# Patient Record
Sex: Male | Born: 1950 | Hispanic: No | Marital: Married | Smoking: Never smoker
Health system: Southern US, Community
[De-identification: ages and names within clinical notes are randomized; demographics above are authoritative.]

## PROBLEM LIST (undated history)

## (undated) DIAGNOSIS — E039 Hypothyroidism, unspecified: Secondary | ICD-10-CM

## (undated) DIAGNOSIS — K7460 Unspecified cirrhosis of liver: Secondary | ICD-10-CM

## (undated) DIAGNOSIS — E119 Type 2 diabetes mellitus without complications: Secondary | ICD-10-CM

## (undated) DIAGNOSIS — D61818 Other pancytopenia: Secondary | ICD-10-CM

## (undated) DIAGNOSIS — E1140 Type 2 diabetes mellitus with diabetic neuropathy, unspecified: Secondary | ICD-10-CM

## (undated) DIAGNOSIS — I10 Essential (primary) hypertension: Secondary | ICD-10-CM

## (undated) HISTORY — DX: Type 2 diabetes mellitus without complications: E11.9

## (undated) HISTORY — DX: Type 2 diabetes mellitus with diabetic neuropathy, unspecified: E11.40

---

## 2004-07-16 ENCOUNTER — Emergency Department (HOSPITAL_COMMUNITY): Admission: EM | Admit: 2004-07-16 | Discharge: 2004-07-16 | Payer: Self-pay | Admitting: Emergency Medicine

## 2012-03-19 ENCOUNTER — Emergency Department (HOSPITAL_COMMUNITY)
Admission: EM | Admit: 2012-03-19 | Discharge: 2012-03-20 | Disposition: A | Discharge status: Home / Self Care | Payer: BC Managed Care – PPO | Attending: Emergency Medicine | Admitting: Emergency Medicine

## 2012-03-19 ENCOUNTER — Encounter (HOSPITAL_COMMUNITY): Payer: Self-pay | Admitting: *Deleted

## 2012-03-19 ENCOUNTER — Emergency Department (HOSPITAL_COMMUNITY): Payer: BC Managed Care – PPO

## 2012-03-19 DIAGNOSIS — M7989 Other specified soft tissue disorders: Secondary | ICD-10-CM | POA: Insufficient documentation

## 2012-03-19 DIAGNOSIS — Z8719 Personal history of other diseases of the digestive system: Secondary | ICD-10-CM | POA: Insufficient documentation

## 2012-03-19 DIAGNOSIS — Z79899 Other long term (current) drug therapy: Secondary | ICD-10-CM | POA: Insufficient documentation

## 2012-03-19 DIAGNOSIS — R509 Fever, unspecified: Secondary | ICD-10-CM | POA: Insufficient documentation

## 2012-03-19 DIAGNOSIS — D61818 Other pancytopenia: Secondary | ICD-10-CM

## 2012-03-19 DIAGNOSIS — R799 Abnormal finding of blood chemistry, unspecified: Secondary | ICD-10-CM | POA: Insufficient documentation

## 2012-03-19 DIAGNOSIS — R259 Unspecified abnormal involuntary movements: Secondary | ICD-10-CM | POA: Insufficient documentation

## 2012-03-19 DIAGNOSIS — E039 Hypothyroidism, unspecified: Secondary | ICD-10-CM | POA: Insufficient documentation

## 2012-03-19 DIAGNOSIS — M79609 Pain in unspecified limb: Secondary | ICD-10-CM | POA: Insufficient documentation

## 2012-03-19 HISTORY — DX: Essential (primary) hypertension: I10

## 2012-03-19 LAB — URINALYSIS, ROUTINE W REFLEX MICROSCOPIC
Bilirubin Urine: NEGATIVE
Glucose, UA: NEGATIVE mg/dL
Hgb urine dipstick: NEGATIVE
Ketones, ur: NEGATIVE mg/dL
Leukocytes, UA: NEGATIVE
Nitrite: NEGATIVE
Protein, ur: NEGATIVE mg/dL
Specific Gravity, Urine: 1.006 (ref 1.005–1.030)
Urobilinogen, UA: 1 mg/dL (ref 0.0–1.0)
pH: 7 (ref 5.0–8.0)

## 2012-03-19 MED ORDER — SODIUM CHLORIDE 0.9 % IV BOLUS (SEPSIS)
1000.0000 mL | Freq: Once | INTRAVENOUS | Status: AC
  Administered 2012-03-19: 1000 mL via INTRAVENOUS

## 2012-03-19 MED ORDER — IBUPROFEN 200 MG PO TABS
600.0000 mg | ORAL_TABLET | Freq: Once | ORAL | Status: AC
  Administered 2012-03-19: 600 mg via ORAL
  Filled 2012-03-19: qty 3

## 2012-03-19 NOTE — ED Notes (Signed)
Per family report: pt reports of body aches especially in his legs.  Pt has become increasingly weak.  Family reports slurred speech that has been going on 2 months and the weakness has been going on for about 3 months.  Pt has taken a few trips to [place] with his last trip in November.  Family reports that he is unable to get out of bed today and his medications have been making him vomit.  Pt denies sore throat or cough.

## 2012-03-19 NOTE — ED Notes (Addendum)
Patient c/o BLE pain. Patient had headache at home, decreased energy. Family reports slow hunched/slumped over walking, patient states it is more comfortable for him to walk this way versus standing up straight. Family states patient is slurring his speech, not noted by Clinical research associate. Patient speech is slow. Patient states that he has become more forgetful recently. Patient reports for past few months he has been having nosebleeds in the morning, states sometimes it is daily, then it will stop for a couple days. Family states patient was wearing multiple layers when they arrived to his home, but was c/o still having chills. Patient reports having taken Tylenol at home at @1600 . Patient recently traveled to [place] in May and November of 2012, also November 2011. Family states he became ill after his travel in November 2011 and never fully recovered, symptoms similar to know, but more intense at this time. Patient states he has pain with walking, but states no pain when laying down. Denies pain at this time. 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 Patient taking Tramadol, family states this was prescribed for arthritis and leg pain, believes it was prescribed by [facility]. Family also notes that he will spontaneously fall asleep during the day, patient sleeps with his mouth open and drools, patient has stated to family he is unable to control this.
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
## 2012-03-20 LAB — CK: Total CK: 168 U/L (ref 7–232)

## 2012-03-20 LAB — DIFFERENTIAL
Basophils Absolute: 0 10*3/uL (ref 0.0–0.1)
Basophils Relative: 1 % (ref 0–1)
Eosinophils Absolute: 0.1 10*3/uL (ref 0.0–0.7)
Eosinophils Relative: 2 % (ref 0–5)
Lymphocytes Relative: 26 % (ref 12–46)
Lymphs Abs: 1 10*3/uL (ref 0.7–4.0)
Monocytes Absolute: 0.9 10*3/uL (ref 0.1–1.0)
Monocytes Relative: 23 % — ABNORMAL HIGH (ref 3–12)
Neutro Abs: 1.9 10*3/uL (ref 1.7–7.7)
Neutrophils Relative %: 49 % (ref 43–77)

## 2012-03-20 LAB — BASIC METABOLIC PANEL
BUN: 9 mg/dL (ref 6–23)
CO2: 24 mEq/L (ref 19–32)
Calcium: 8.4 mg/dL (ref 8.4–10.5)
Chloride: 104 mEq/L (ref 96–112)
Creatinine, Ser: 0.78 mg/dL (ref 0.50–1.35)
GFR calc Af Amer: >90 mL/min (ref >90)
GFR calc non Af Amer: >90 mL/min (ref >90)
Glucose, Bld: 110 mg/dL — ABNORMAL HIGH (ref 70–99)
Potassium: 3.6 mEq/L (ref 3.5–5.1)
Sodium: 137 mEq/L (ref 135–145)

## 2012-03-20 LAB — CBC
HCT: 28.6 % — ABNORMAL LOW (ref 39.0–52.0)
Hemoglobin: 9.5 g/dL — ABNORMAL LOW (ref 13.0–17.0)
MCH: 28.2 pg (ref 26.0–34.0)
MCHC: 33.2 g/dL (ref 30.0–36.0)
MCV: 84.9 fL (ref 78.0–100.0)
Platelets: 68 10*3/uL — ABNORMAL LOW (ref 150–400)
RBC: 3.37 MIL/uL — ABNORMAL LOW (ref 4.22–5.81)
RDW: 15.3 % (ref 11.5–15.5)
WBC: 3.7 10*3/uL — ABNORMAL LOW (ref 4.0–10.5)

## 2012-03-20 LAB — TSH: TSH: 23.742 u[IU]/mL — ABNORMAL HIGH (ref 0.350–4.500)

## 2012-03-21 ENCOUNTER — Emergency Department (HOSPITAL_COMMUNITY): Payer: BC Managed Care – PPO

## 2012-03-21 ENCOUNTER — Telehealth: Payer: Self-pay | Admitting: Internal Medicine

## 2012-03-21 ENCOUNTER — Encounter (HOSPITAL_COMMUNITY): Payer: Self-pay | Admitting: Radiology

## 2012-03-21 ENCOUNTER — Inpatient Hospital Stay (HOSPITAL_COMMUNITY)
Admission: EM | Admit: 2012-03-21 | Discharge: 2012-03-24 | DRG: 320 | Disposition: A | Discharge status: Home / Self Care | Payer: BC Managed Care – PPO | Attending: Internal Medicine | Admitting: Internal Medicine

## 2012-03-21 DIAGNOSIS — K7460 Unspecified cirrhosis of liver: Secondary | ICD-10-CM | POA: Diagnosis present

## 2012-03-21 DIAGNOSIS — D638 Anemia in other chronic diseases classified elsewhere: Secondary | ICD-10-CM | POA: Diagnosis present

## 2012-03-21 DIAGNOSIS — R509 Fever, unspecified: Secondary | ICD-10-CM

## 2012-03-21 DIAGNOSIS — Z79899 Other long term (current) drug therapy: Secondary | ICD-10-CM

## 2012-03-21 DIAGNOSIS — E538 Deficiency of other specified B group vitamins: Secondary | ICD-10-CM | POA: Diagnosis present

## 2012-03-21 DIAGNOSIS — D509 Iron deficiency anemia, unspecified: Secondary | ICD-10-CM | POA: Diagnosis present

## 2012-03-21 DIAGNOSIS — R7301 Impaired fasting glucose: Secondary | ICD-10-CM | POA: Diagnosis present

## 2012-03-21 DIAGNOSIS — A4901 Methicillin susceptible Staphylococcus aureus infection, unspecified site: Secondary | ICD-10-CM | POA: Diagnosis present

## 2012-03-21 DIAGNOSIS — D684 Acquired coagulation factor deficiency: Secondary | ICD-10-CM | POA: Diagnosis present

## 2012-03-21 DIAGNOSIS — E039 Hypothyroidism, unspecified: Secondary | ICD-10-CM | POA: Diagnosis present

## 2012-03-21 DIAGNOSIS — D696 Thrombocytopenia, unspecified: Secondary | ICD-10-CM | POA: Diagnosis present

## 2012-03-21 DIAGNOSIS — Z23 Encounter for immunization: Secondary | ICD-10-CM

## 2012-03-21 DIAGNOSIS — N390 Urinary tract infection, site not specified: Principal | ICD-10-CM | POA: Diagnosis present

## 2012-03-21 DIAGNOSIS — D61818 Other pancytopenia: Secondary | ICD-10-CM | POA: Diagnosis present

## 2012-03-21 DIAGNOSIS — I10 Essential (primary) hypertension: Secondary | ICD-10-CM | POA: Diagnosis present

## 2012-03-21 HISTORY — DX: Hypothyroidism, unspecified: E03.9

## 2012-03-21 HISTORY — DX: Other pancytopenia: D61.818

## 2012-03-21 HISTORY — DX: Unspecified cirrhosis of liver: K74.60

## 2012-03-21 LAB — URINALYSIS, ROUTINE W REFLEX MICROSCOPIC
Bilirubin Urine: NEGATIVE
Glucose, UA: 100 mg/dL — AB
Protein, ur: NEGATIVE mg/dL
Specific Gravity, Urine: 1.013 (ref 1.005–1.030)
Urobilinogen, UA: 0.2 mg/dL (ref 0.0–1.0)
pH: 5 (ref 5.0–8.0)

## 2012-03-21 LAB — COMPREHENSIVE METABOLIC PANEL
ALT: 25 U/L (ref 0–53)
Albumin: 2.6 g/dL — ABNORMAL LOW (ref 3.5–5.2)
Calcium: 8.4 mg/dL (ref 8.4–10.5)
GFR calc Af Amer: >90 mL/min (ref >90)
GFR calc non Af Amer: 89 mL/min — ABNORMAL LOW (ref >90)
Glucose, Bld: 204 mg/dL — ABNORMAL HIGH (ref 70–99)
Potassium: 3.3 mEq/L — ABNORMAL LOW (ref 3.5–5.1)

## 2012-03-21 LAB — CBC WITH DIFFERENTIAL/PLATELET
Basophils Relative: 0 % (ref 0–1)
Eosinophils Absolute: 0 10*3/uL (ref 0.0–0.7)
Eosinophils Relative: 0 % (ref 0–5)
Hemoglobin: 9.9 g/dL — ABNORMAL LOW (ref 13.0–17.0)
MCHC: 33.9 g/dL (ref 30.0–36.0)
MCV: 83.9 fL (ref 78.0–100.0)
Monocytes Absolute: 0.7 10*3/uL (ref 0.1–1.0)
Monocytes Relative: 14 % — ABNORMAL HIGH (ref 3–12)
Neutro Abs: 2.6 10*3/uL (ref 1.7–7.7)
RBC: 3.48 MIL/uL — ABNORMAL LOW (ref 4.22–5.81)
WBC: 5 10*3/uL (ref 4.0–10.5)

## 2012-03-21 LAB — GLUCOSE, CAPILLARY

## 2012-03-21 LAB — LACTIC ACID, PLASMA: Lactic Acid, Venous: 2 mmol/L (ref 0.5–2.2)

## 2012-03-21 LAB — URINE MICROSCOPIC-ADD ON

## 2012-03-21 LAB — PROTIME-INR
INR: 1.54 — ABNORMAL HIGH (ref 0.00–1.49)
Prothrombin Time: 18 seconds — ABNORMAL HIGH (ref 11.6–15.2)

## 2012-03-21 LAB — MAGNESIUM: Magnesium: 1.7 mg/dL (ref 1.5–2.5)

## 2012-03-21 MED ORDER — INSULIN GLARGINE 100 UNIT/ML ~~LOC~~ SOLN
5.0000 [IU] | Freq: Every day | SUBCUTANEOUS | Status: DC
  Administered 2012-03-21: 5 [IU] via SUBCUTANEOUS

## 2012-03-21 MED ORDER — ZOLPIDEM TARTRATE 5 MG PO TABS: 5.0000 mg | ORAL_TABLET | Freq: Every evening | ORAL | Status: DC | PRN

## 2012-03-21 MED ORDER — ONDANSETRON HCL 4 MG/2ML IJ SOLN: 4.0000 mg | Freq: Four times a day (QID) | INTRAMUSCULAR | Status: DC | PRN

## 2012-03-21 MED ORDER — LEVOTHYROXINE SODIUM 150 MCG PO TABS
150.0000 ug | ORAL_TABLET | Freq: Every day | ORAL | Status: DC
  Administered 2012-03-22 – 2012-03-24 (×3): 150 ug via ORAL
  Filled 2012-03-21 (×4): qty 1

## 2012-03-21 MED ORDER — ADULT MULTIVITAMIN W/MINERALS CH
1.0000 | ORAL_TABLET | Freq: Every day | ORAL | Status: DC
  Administered 2012-03-22 – 2012-03-24 (×3): 1 via ORAL
  Filled 2012-03-21 (×3): qty 1

## 2012-03-21 MED ORDER — HYDROCODONE-ACETAMINOPHEN 5-325 MG PO TABS
1.0000 | ORAL_TABLET | ORAL | Status: DC | PRN
  Administered 2012-03-21: 2 via ORAL
  Administered 2012-03-23 (×2): 1 via ORAL
  Filled 2012-03-21 (×2): qty 1
  Filled 2012-03-21: qty 2

## 2012-03-21 MED ORDER — LEVOFLOXACIN IN D5W 500 MG/100ML IV SOLN
500.0000 mg | INTRAVENOUS | Status: DC
  Administered 2012-03-21: 500 mg via INTRAVENOUS
  Filled 2012-03-21 (×2): qty 100

## 2012-03-21 MED ORDER — POLYETHYLENE GLYCOL 3350 17 G PO PACK
17.0000 g | PACK | Freq: Every day | ORAL | Status: DC | PRN
  Filled 2012-03-21: qty 1

## 2012-03-21 MED ORDER — CIPROFLOXACIN IN D5W 400 MG/200ML IV SOLN
400.0000 mg | Freq: Once | INTRAVENOUS | Status: AC
  Administered 2012-03-21: 400 mg via INTRAVENOUS
  Filled 2012-03-21: qty 200

## 2012-03-21 MED ORDER — SODIUM CHLORIDE 0.9 % IV BOLUS (SEPSIS)
1000.0000 mL | Freq: Once | INTRAVENOUS | Status: AC
  Administered 2012-03-21: 1000 mL via INTRAVENOUS

## 2012-03-21 MED ORDER — ONDANSETRON HCL 4 MG PO TABS: 4.0000 mg | ORAL_TABLET | Freq: Four times a day (QID) | ORAL | Status: DC | PRN

## 2012-03-21 MED ORDER — ALBUTEROL SULFATE (5 MG/ML) 0.5% IN NEBU: 2.5000 mg | INHALATION_SOLUTION | RESPIRATORY_TRACT | Status: DC | PRN

## 2012-03-21 MED ORDER — IOHEXOL 300 MG/ML  SOLN
100.0000 mL | Freq: Once | INTRAMUSCULAR | Status: AC | PRN
  Administered 2012-03-21: 100 mL via INTRAVENOUS

## 2012-03-21 MED ORDER — ALUM & MAG HYDROXIDE-SIMETH 200-200-20 MG/5ML PO SUSP: 30.0000 mL | Freq: Four times a day (QID) | ORAL | Status: DC | PRN

## 2012-03-21 MED ORDER — FERROUS SULFATE 27 MG PO TABS: 1.0000 | ORAL_TABLET | Freq: Every day | ORAL | Status: DC

## 2012-03-21 MED ORDER — FERROUS SULFATE 325 (65 FE) MG PO TABS
325.0000 mg | ORAL_TABLET | Freq: Every day | ORAL | Status: DC
  Filled 2012-03-21 (×2): qty 1

## 2012-03-21 MED ORDER — POTASSIUM CHLORIDE CRYS ER 20 MEQ PO TBCR
40.0000 meq | EXTENDED_RELEASE_TABLET | Freq: Once | ORAL | Status: AC
  Administered 2012-03-21: 40 meq via ORAL
  Filled 2012-03-21: qty 2

## 2012-03-21 MED ORDER — IOHEXOL 300 MG/ML  SOLN
25.0000 mL | INTRAMUSCULAR | Status: AC
  Administered 2012-03-21 (×2): 25 mL via ORAL

## 2012-03-21 MED ORDER — SODIUM CHLORIDE 0.9 % IV SOLN
INTRAVENOUS | Status: DC
  Administered 2012-03-21 – 2012-03-22 (×2): via INTRAVENOUS

## 2012-03-21 MED ORDER — INSULIN ASPART 100 UNIT/ML ~~LOC~~ SOLN
0.0000 [IU] | Freq: Three times a day (TID) | SUBCUTANEOUS | Status: DC
  Administered 2012-03-23 – 2012-03-24 (×2): 1 [IU] via SUBCUTANEOUS

## 2012-03-21 MED ORDER — MULTI-VITAMIN/MINERALS PO TABS: 1.0000 | ORAL_TABLET | Freq: Every day | ORAL | Status: DC

## 2012-03-21 MED ORDER — GUAIFENESIN-DM 100-10 MG/5ML PO SYRP
5.0000 mL | ORAL_SOLUTION | ORAL | Status: DC | PRN
  Filled 2012-03-21: qty 5

## 2012-03-21 MED ORDER — INFLUENZA VIRUS VACC SPLIT PF IM SUSP
0.5000 mL | INTRAMUSCULAR | Status: DC
  Filled 2012-03-21: qty 0.5

## 2012-03-21 NOTE — Progress Notes (Signed)
Pt was admitted to unit from ED. Pt is A&O, VS stable, and skin intact. Pt's family is currently at bedside and pt is resting comfortably in bed.

## 2012-03-21 NOTE — Telephone Encounter (Signed)
S/W pt dtr in re NP appt 02/18 @ 9:30 w/Dr. Arbutus Ped. Dx-Pancytopenia Welcome packet in-offce

## 2012-03-21 NOTE — ED Notes (Signed)
Vomited x 1 yetserrday family states  But none today

## 2012-03-21 NOTE — H&P (Addendum)
[facility]                                                                                    Patient Demographics  Jonathon Cannon, is a 62 y.o. male  CSN: [id number]  MRN: [id number]  DOB - [birth year]  Admit Date - 03/21/2012  Outpatient Primary MD for the patient is Default, Provider, MD   With History of -  Past Medical History  Diagnosis Date  . Hypertension   . Hypothyroid   . Pancytopenia   . Cirrhosis       No past surgical history on file.  in for   Fevers, generalized weakness.   HPI  Jonathon Cannon  is a 62 y.o. male, with history of hypothyroidism, hypertension, neck lower extremity aches for several years, who was in his usual state of health till about 2 weeks ago when he started experiencing generalized weakness, worsening of his lower leg aches, along with fevers and chills, he presented to [facility] ER 2 days ago where he was given pain medications and anti-inflammatory medications for his fevers and sent home.   He continued to feel poorly and today presented to [facility] where he was diagnosed with UTI, elevated TSH in mid 20s, fevers of 101 and I was called to get the patient.    Review of Systems    In addition to the HPI above,  + Fever-chills, No Headache, No changes with Vision or hearing, No problems swallowing food or Liquids, No Chest pain, Cough or Shortness of Breath, No Abdominal pain, No Nausea or Vommitting, Bowel movements are regular, No Blood in stool or Urine, No dysuria, No new skin rashes or bruises, No new joints pains-aches, +ve leg aches No new weakness, tingling, numbness in any extremity, No recent weight gain or loss, generalized weakness No polyuria, polydypsia or polyphagia, No significant Mental Stressors.  A full 10 point Review of Systems was done, except as stated above, all other Review of Systems were negative.   Social History History  Substance Use Topics  . Smoking status: Never  Smoker   . Smokeless tobacco: Not on file  . Alcohol Use: Yes     Comment: Occasional     Family History No family history of colon cancer, prostate cancer, lung cancer  Prior to Admission medications   Medication Sig Start Date End Date Taking? Authorizing Provider  Cholecalciferol (VITAMIN D3) 5000 UNITS CAPS Take 1 capsule by mouth daily.   Yes Historical Provider, MD  Ferrous Sulfate (PX IRON) 27 MG TABS Take 1 tablet by mouth daily.   Yes Historical Provider, MD  furosemide (LASIX) 20 MG tablet Take 20 mg by mouth daily.   Yes Historical Provider, MD  HYDROcodone-acetaminophen (NORCO) 10-325 MG per tablet Take 1 tablet by mouth every 8 (eight) hours as needed for pain.   Yes Historical Provider, MD  levothyroxine (SYNTHROID, LEVOTHROID) 100 MCG tablet Take 100 mcg by mouth daily.   Yes Historical Provider, MD  meloxicam (MOBIC) 15 MG tablet Take 15 mg by mouth daily as needed. For inflammation & pain   Yes Historical Provider, MD  Multiple Minerals-Vitamins (CALCIUM &  VIT D3 BONE HEALTH PO) Take 1 tablet by mouth daily.   Yes Historical Provider, MD  Multiple Vitamins-Minerals (MULTIVITAMIN WITH MINERALS) tablet Take 1 tablet by mouth daily.   Yes Historical Provider, MD  traMADol (ULTRAM) 50 MG tablet Take 50 mg by mouth 2 (two) times daily as needed for pain.   Yes Historical Provider, MD    No Known Allergies  Physical Exam  Vitals  Blood pressure 146/82, pulse 89, temperature 101.1 F (38.4 C), temperature source Oral, resp. rate 27, SpO2 100.00%.   1. General middle-aged E. Stitt Asian male lying in bed in NAD,    2. Normal affect and insight, Not Suicidal or Homicidal, Awake Alert, Oriented X 3.  3. No F.N deficits, ALL C.Nerves Intact, Strength 5/5 all 4 extremities, Sensation intact all 4 extremities, Plantars down going.  4. Ears and Eyes appear Normal, Conjunctivae clear, PERRLA. Moist Oral Mucosa.  5. Supple Neck, No JVD, No cervical lymphadenopathy  appriciated, No Carotid Bruits.  6. Symmetrical Chest wall movement, Good air movement bilaterally, CTAB.  7. RRR, No Gallops, Rubs or Murmurs, No Parasternal Heave.  8. Positive Bowel Sounds, Abdomen Soft, Non tender, No organomegaly appriciated,No rebound -guarding or rigidity.  9.  No Cyanosis, Normal Skin Turgor, No Skin Rash or Bruise.  10. Good muscle tone,  joints appear normal , no effusions, Normal ROM.  11. No Palpable Lymph Nodes in Neck or Axillae    Data Review  CBC  Recent Labs Lab 03/19/12 2345 03/21/12 1246  WBC 3.7* 5.0  HGB 9.5* 9.9*  HCT 28.6* 29.2*  PLT 68* 67*  MCV 84.9 83.9  MCH 28.2 28.4  MCHC 33.2 33.9  RDW 15.3 15.4  LYMPHSABS 1.0 1.6  MONOABS 0.9 0.7  EOSABS 0.1 0.0  BASOSABS 0.0 0.0   ------------------------------------------------------------------------------------------------------------------  Chemistries   Recent Labs Lab 03/19/12 2345 03/21/12 1246  NA 137 136  K 3.6 3.3*  CL 104 102  CO2 24 24  GLUCOSE 110* 204*  BUN 9 11  CREATININE 0.78 0.95  CALCIUM 8.4 8.4  AST  --  49*  ALT  --  25  ALKPHOS  --  128*  BILITOT  --  1.1   ------------------------------------------------------------------------------------------------------------------ CrCl is unknown because there is no height on file for the current visit. ------------------------------------------------------------------------------------------------------------------  Recent Labs  03/19/12 2345  TSH 23.742*     Coagulation profile No results found for this basename: INR, PROTIME,  in the last 168 hours ------------------------------------------------------------------------------------------------------------------- No results found for this basename: DDIMER,  in the last 72 hours -------------------------------------------------------------------------------------------------------------------  Cardiac Enzymes No results found for this basename: CK,  CKMB, TROPONINI, MYOGLOBIN,  in the last 168 hours ------------------------------------------------------------------------------------------------------------------ No components found with this basename: POCBNP,    ---------------------------------------------------------------------------------------------------------------  Urinalysis    Component Value Date/Time   COLORURINE YELLOW 03/21/2012 1300   APPEARANCEUR CLEAR 03/21/2012 1300   LABSPEC 1.013 03/21/2012 1300   PHURINE 5.0 03/21/2012 1300   GLUCOSEU 100* 03/21/2012 1300   HGBUR TRACE* 03/21/2012 1300   BILIRUBINUR NEGATIVE 03/21/2012 1300   KETONESUR NEGATIVE 03/21/2012 1300   PROTEINUR NEGATIVE 03/21/2012 1300   UROBILINOGEN 0.2 03/21/2012 1300   NITRITE NEGATIVE 03/21/2012 1300   LEUKOCYTESUR MODERATE* 03/21/2012 1300    ----------------------------------------------------------------------------------------------------------------  Imaging results:   Dg Chest 2 View  03/21/2012  *RADIOLOGY REPORT*  Clinical Data: cough  CHEST - 2 VIEW  Comparison: 03/19/2012  Findings: Normal heart size.  No pleural effusion or edema.  There is no airspace consolidation identified.  Spondylosis noted within the thoracic spine.  IMPRESSION:  1.  No acute cardiopulmonary abnormalities.   Original Report Authenticated By: Signa Kell, M.D.    Dg Chest 2 View  03/19/2012  *RADIOLOGY REPORT*  Clinical Data: Fever and weakness for 2 days.  CHEST - 2 VIEW  Comparison: None.  Findings: The lungs are clear and fully expanded.  No infiltrates or masses.  No effusions or pneumothoraces.  The aorta is tortuous. The heart and mediastinal structures are normal.  There are no acute bony changes.  IMPRESSION: No active disease.   Original Report Authenticated By: Sander Radon, M.D.    Ct Head Wo Contrast  03/20/2012  *RADIOLOGY REPORT*  Clinical Data: Body aches and weakness; confusion.  Slurred speech.  CT HEAD WITHOUT CONTRAST  Technique:  Contiguous  axial images were obtained from the base of the skull through the vertex without contrast.  Comparison: None.  Findings: There is no evidence of acute infarction, mass lesion, or intra- or extra-axial hemorrhage on CT.  The posterior fossa, including the cerebellum, brainstem and fourth ventricle, is within normal limits.  The third and lateral ventricles, and basal ganglia are unremarkable in appearance.  The cerebral hemispheres are symmetric in appearance, with normal gray- white differentiation.  No mass effect or midline shift is seen.  There is no evidence of fracture; visualized osseous structures are unremarkable in appearance.  The orbits are within normal limits. A mucus retention cyst or polyp is noted at the left maxillary sinus, and mild mucosal thickening is noted at the right maxillary sinus.  There is partial opacification of the ethmoid air cells. The The remaining paranasal sinuses and mastoid air cells are well- aerated.  No significant soft tissue abnormalities are seen.  IMPRESSION:  1.  No acute intracranial pathology seen on CT. 2.  Mucus retention cyst or polyp at the left maxillary sinus, and mild mucosal thickening at the right maxillary sinus.   Original Report Authenticated By: Tonia Ghent, M.D.    Ct Abdomen Pelvis W Contrast  03/21/2012  *RADIOLOGY REPORT*  Clinical Data: Fever, abdominal pain.  CT ABDOMEN AND PELVIS WITH CONTRAST  Technique:  Multidetector CT imaging of the abdomen and pelvis was performed following the standard protocol during bolus administration of intravenous contrast.  Contrast: OMNIPAQUE IOHEXOL 300 MG/ML  SOLN  Comparison: None.  Findings: Visualized lung bases clear.  Liver has a nodular contour, without focal lesion or intrahepatic biliary ductal dilatation.  Gallbladder is nondistended.  Splenomegaly, 16.2 cm craniocaudal length.  Unremarkable adrenal glands, pancreas, kidneys.  Patchy aortoiliac plaque without aneurysm.  Stomach, small bowel, and  colon are nondilated.  Normal appendix.  No ascites.  Urinary bladder incompletely distended.  Bilateral pelvic phleboliths.  No of free air.  Mild spondylitic changes in the lower lumbar spine.  IMPRESSION:  1.  Nodular liver contour suggesting cirrhosis, with splenomegaly but no other stigmata of portal venous hypertension. 2.  No acute abdominal process.   Original Report Authenticated By: D. Andria Rhein, MD      Assessment & Plan  1. Fevers, lower leg aches acute on chronic, UTI along with uncontrolled hypothyroidism and possible undiagnosed diabetes mellitus - he she will be admitted to the hospital, blood and urine cultures will be obtained, will check renal ultrasound to rule out prostatic obstruction, PSA levels will be ordered, temperature Levaquin to be dosed by pharmacy, IV fluids, since his leg aches are worse we'll check CK levels.   2. Poorly controlled hypothyroidism  with TSH in mid 20s. Will increase home dose Synthroid, repeat TSH free T3 in 4-6 weeks outpatient.   3. Cirrhosis noted on CT scan incidentally. With some splenomegaly. Patient denies any history of hepatitis, no history of significant alcohol use. This could represent Elita Boone. Will check acute hepatitis panel, outpatient GI followup post discharge.   4. Normocytic anemia. The anemia of chronic disease due to cirrhosis above along with thrombocytopenia which also could be from cirrhosis or organomegaly.- We'll avoid blood thinners, will check anemia panel, if iron deficient iron deficiency workup can be initiated outpatient. Will order a peripheral smear. He has outpatient followup with Dr. Arbutus Ped will follow with him post discharge.   5. Elevated sugars. No previous history of diabetes mellitus, will check A1c, we'll place him on low-dose Lantus along with sliding scale insulin and car modified diet.    DVT Prophylaxis   SCDs    AM Labs Ordered, also please review Full Orders  Family Communication: Admission,  patients condition and plan of care including tests being ordered have been discussed with the patient and family who indicate understanding and agree with the plan and Code Status.  Code Status full  Likely DC to  home  Time spent in minutes : 40  Condition Marinell Blight K M.D on 03/21/2012 at 6:59 PM  Between 7am to 7pm - Pager - [phone]  After 7pm go to [email or web address] - password [email or web address]  And look for the night coverage person covering me after hours  [facility] Office  [phone]

## 2012-03-21 NOTE — ED Provider Notes (Signed)
Handoff from Lawyer PA-C at shift change.  Patient pending CT abdomen and pelvis.   CT abdomen and pelvis shows splenomegaly with possible cirrhotic changes of the liver. Patient has increased fever and continues to feel weak. These findings I would make for further evaluation. Patient has received IV Cipro in emergency department.  Patient and family informed.  No further vomiting.   [facility] to see patient.   Renne Crigler, [place] 03/21/12 [phone]

## 2012-03-21 NOTE — ED Provider Notes (Signed)
History     CSN: [id number]  Arrival date & time 03/21/12  1225   First MD Initiated Contact with Patient 03/21/12 1308      No chief complaint on file.   (Consider location/radiation/quality/duration/timing/severity/associated sxs/prior treatment) HPI Patient presents emergency department with fever, body aches, and abdominal pain for the last 5 days.  Patient was seen at [facility] long emergency department and had blood work and x-rays done at that time and was referred to hematology for anemia.  Patient presents emergency department for further evaluation of fever with vomiting and abdominal pain.  Patient denies chest pain, shortness of breath, headache, visual changes, back pain, weakness, numbness, dizziness, or syncope.  Patient was not given any treatment prior to arrival.  Nothing seems to make his condition, better.ating makes his condition worse Past Medical History  Diagnosis Date  . Hypertension     No past surgical history on file.  No family history on file.  History  Substance Use Topics  . Smoking status: Never Smoker   . Smokeless tobacco: Not on file  . Alcohol Use: Yes     Comment: Occasional      Review of Systems All other systems negative except as documented in the HPI. All pertinent positives and negatives as reviewed in the HPI. Allergies  Review of patient's allergies indicates no known allergies.  Home Medications   Current Outpatient Rx  Name  Route  Sig  Dispense  Refill  . Cholecalciferol (VITAMIN D3) 5000 UNITS CAPS   Oral   Take 1 capsule by mouth daily.         . Ferrous Sulfate (PX IRON) 27 MG TABS   Oral   Take 1 tablet by mouth daily.         . furosemide (LASIX) 20 MG tablet   Oral   Take 20 mg by mouth daily.         Marland Kitchen HYDROcodone-acetaminophen (NORCO) 10-325 MG per tablet   Oral   Take 1 tablet by mouth every 8 (eight) hours as needed for pain.         Marland Kitchen levothyroxine (SYNTHROID, LEVOTHROID) 100 MCG tablet    Oral   Take 100 mcg by mouth daily.         . meloxicam (MOBIC) 15 MG tablet   Oral   Take 15 mg by mouth daily as needed. For inflammation & pain         . Multiple Minerals-Vitamins (CALCIUM & VIT D3 BONE HEALTH PO)   Oral   Take 1 tablet by mouth daily.         . Multiple Vitamins-Minerals (MULTIVITAMIN WITH MINERALS) tablet   Oral   Take 1 tablet by mouth daily.         . traMADol (ULTRAM) 50 MG tablet   Oral   Take 50 mg by mouth 2 (two) times daily as needed for pain.           BP 133/80  Pulse 89  Temp(Src) 99.7 F (37.6 C) (Oral)  Resp 16  SpO2 100%  Physical Exam  Nursing note and vitals reviewed. Constitutional: He appears well-developed and well-nourished.  HENT:  Head: Normocephalic and atraumatic.  Eyes: Pupils are equal, round, and reactive to light.  Neck: Normal range of motion. Neck supple.  Cardiovascular: Normal rate, regular rhythm and normal heart sounds.   Pulmonary/Chest: Effort normal and breath sounds normal. No respiratory distress.  Abdominal: Soft. He exhibits no distension. There is generalized  tenderness. There is no rebound and no guarding.    Neurological: He is alert.  Skin: Skin is warm and dry. No rash noted.    ED Course  Procedures (including critical care time)  Labs Reviewed  CBC WITH DIFFERENTIAL - Abnormal; Notable for the following:    RBC 3.48 (*)    Hemoglobin 9.9 (*)    HCT 29.2 (*)    Platelets 67 (*)    Monocytes Relative 14 (*)    All other components within normal limits  COMPREHENSIVE METABOLIC PANEL - Abnormal; Notable for the following:    Potassium 3.3 (*)    Glucose, Bld 204 (*)    Albumin 2.6 (*)    AST 49 (*)    Alkaline Phosphatase 128 (*)    GFR calc non Af Amer 89 (*)    All other components within normal limits  URINALYSIS, ROUTINE W REFLEX MICROSCOPIC - Abnormal; Notable for the following:    Glucose, UA 100 (*)    Hgb urine dipstick TRACE (*)    Leukocytes, UA MODERATE (*)    All  other components within normal limits  URINE MICROSCOPIC-ADD ON - Abnormal; Notable for the following:    Bacteria, UA MANY (*)    Casts HYALINE CASTS (*)    All other components within normal limits  URINE CULTURE  LACTIC ACID, PLASMA   Dg Chest 2 View  03/21/2012  *RADIOLOGY REPORT*  Clinical Data: cough  CHEST - 2 VIEW  Comparison: 03/19/2012  Findings: Normal heart size.  No pleural effusion or edema.  There is no airspace consolidation identified.  Spondylosis noted within the thoracic spine.  IMPRESSION:  1.  No acute cardiopulmonary abnormalities.   Original Report Authenticated By: Signa Kell, M.D.    Dg Chest 2 View  03/19/2012  *RADIOLOGY REPORT*  Clinical Data: Fever and weakness for 2 days.  CHEST - 2 VIEW  Comparison: None.  Findings: The lungs are clear and fully expanded.  No infiltrates or masses.  No effusions or pneumothoraces.  The aorta is tortuous. The heart and mediastinal structures are normal.  There are no acute bony changes.  IMPRESSION: No active disease.   Original Report Authenticated By: Sander Radon, M.D.    Ct Head Wo Contrast  03/20/2012  *RADIOLOGY REPORT*  Clinical Data: Body aches and weakness; confusion.  Slurred speech.  CT HEAD WITHOUT CONTRAST  Technique:  Contiguous axial images were obtained from the base of the skull through the vertex without contrast.  Comparison: None.  Findings: There is no evidence of acute infarction, mass lesion, or intra- or extra-axial hemorrhage on CT.  The posterior fossa, including the cerebellum, brainstem and fourth ventricle, is within normal limits.  The third and lateral ventricles, and basal ganglia are unremarkable in appearance.  The cerebral hemispheres are symmetric in appearance, with normal gray- white differentiation.  No mass effect or midline shift is seen.  There is no evidence of fracture; visualized osseous structures are unremarkable in appearance.  The orbits are within normal limits. A mucus retention  cyst or polyp is noted at the left maxillary sinus, and mild mucosal thickening is noted at the right maxillary sinus.  There is partial opacification of the ethmoid air cells. The The remaining paranasal sinuses and mastoid air cells are well- aerated.  No significant soft tissue abnormalities are seen.  IMPRESSION:  1.  No acute intracranial pathology seen on CT. 2.  Mucus retention cyst or polyp at the left maxillary sinus, and  mild mucosal thickening at the right maxillary sinus.   Original Report Authenticated By: Tonia Ghent, M.D.      1. Fever       MDM          Carlyle Dolly, PA-C 03/21/12 1605

## 2012-03-21 NOTE — ED Notes (Signed)
Patient transported to X-ray 

## 2012-03-21 NOTE — ED Notes (Signed)
P c/o fever, has PCP appoinment toady at 3. Was seen at [facility] sat for same

## 2012-03-21 NOTE — ED Notes (Signed)
Internal Medicine MD at bedside

## 2012-03-21 NOTE — ED Notes (Signed)
Fever that keeps coming back was seen at [facility] on sat  But pt still sick temp running over 100 she states

## 2012-03-21 NOTE — ED Notes (Signed)
Called CT to inform them that pt was done drinking first cup of contrast

## 2012-03-22 ENCOUNTER — Other Ambulatory Visit: Payer: BC Managed Care – PPO | Admitting: Lab

## 2012-03-22 ENCOUNTER — Inpatient Hospital Stay (HOSPITAL_COMMUNITY): Payer: BC Managed Care – PPO

## 2012-03-22 ENCOUNTER — Ambulatory Visit: Payer: BC Managed Care – PPO | Admitting: Internal Medicine

## 2012-03-22 ENCOUNTER — Ambulatory Visit: Payer: BC Managed Care – PPO

## 2012-03-22 DIAGNOSIS — E538 Deficiency of other specified B group vitamins: Secondary | ICD-10-CM

## 2012-03-22 DIAGNOSIS — D638 Anemia in other chronic diseases classified elsewhere: Secondary | ICD-10-CM

## 2012-03-22 DIAGNOSIS — D509 Iron deficiency anemia, unspecified: Secondary | ICD-10-CM | POA: Diagnosis present

## 2012-03-22 LAB — COMPREHENSIVE METABOLIC PANEL
ALT: 19 U/L (ref 0–53)
AST: 37 U/L (ref 0–37)
Alkaline Phosphatase: 107 U/L (ref 39–117)
CO2: 24 mEq/L (ref 19–32)
Calcium: 7.9 mg/dL — ABNORMAL LOW (ref 8.4–10.5)
Chloride: 107 mEq/L (ref 96–112)
GFR calc non Af Amer: >90 mL/min (ref >90)
Glucose, Bld: 113 mg/dL — ABNORMAL HIGH (ref 70–99)
Potassium: 3.3 mEq/L — ABNORMAL LOW (ref 3.5–5.1)
Sodium: 139 mEq/L (ref 135–145)

## 2012-03-22 LAB — HEPATITIS PANEL, ACUTE
Hep A IgM: NEGATIVE
Hep B C IgM: NEGATIVE
Hepatitis B Surface Ag: NEGATIVE

## 2012-03-22 LAB — GLUCOSE, CAPILLARY
Glucose-Capillary: 90 mg/dL (ref 70–99)
Glucose-Capillary: 140 mg/dL — ABNORMAL HIGH (ref 70–99)

## 2012-03-22 LAB — INFLUENZA PANEL BY PCR (TYPE A & B)
H1N1 flu by pcr: NOT DETECTED
Influenza B By PCR: NEGATIVE

## 2012-03-22 LAB — VITAMIN B12: Vitamin B-12: 191 pg/mL — ABNORMAL LOW (ref 211–911)

## 2012-03-22 LAB — FOLATE: Folate: 15.9 ng/mL

## 2012-03-22 LAB — IRON AND TIBC
Saturation Ratios: 5 % — ABNORMAL LOW (ref 20–55)
TIBC: 374 ug/dL (ref 215–435)
UIBC: 354 ug/dL (ref 125–400)

## 2012-03-22 LAB — CBC
Hemoglobin: 8.6 g/dL — ABNORMAL LOW (ref 13.0–17.0)
MCH: 27.5 pg (ref 26.0–34.0)
Platelets: 60 10*3/uL — ABNORMAL LOW (ref 150–400)
RBC: 3.13 MIL/uL — ABNORMAL LOW (ref 4.22–5.81)
WBC: 4.6 10*3/uL (ref 4.0–10.5)

## 2012-03-22 MED ORDER — SENNOSIDES-DOCUSATE SODIUM 8.6-50 MG PO TABS
2.0000 | ORAL_TABLET | Freq: Every day | ORAL | Status: DC
  Filled 2012-03-22: qty 2

## 2012-03-22 MED ORDER — LEVOFLOXACIN IN D5W 500 MG/100ML IV SOLN
500.0000 mg | INTRAVENOUS | Status: DC
  Administered 2012-03-22 – 2012-03-24 (×3): 500 mg via INTRAVENOUS
  Filled 2012-03-22 (×3): qty 100

## 2012-03-22 MED ORDER — CYANOCOBALAMIN 1000 MCG/ML IJ SOLN
1000.0000 ug | Freq: Every day | INTRAMUSCULAR | Status: DC
  Administered 2012-03-22 – 2012-03-24 (×3): 1000 ug via INTRAMUSCULAR
  Filled 2012-03-22 (×3): qty 1

## 2012-03-22 MED ORDER — PANTOPRAZOLE SODIUM 40 MG PO TBEC
40.0000 mg | DELAYED_RELEASE_TABLET | Freq: Every day | ORAL | Status: DC
  Administered 2012-03-22 – 2012-03-24 (×3): 40 mg via ORAL
  Filled 2012-03-22 (×2): qty 1

## 2012-03-22 MED ORDER — POTASSIUM CHLORIDE IN NACL 40-0.9 MEQ/L-% IV SOLN
INTRAVENOUS | Status: AC
  Administered 2012-03-22 – 2012-03-23 (×3): via INTRAVENOUS
  Filled 2012-03-22 (×3): qty 1000

## 2012-03-22 MED ORDER — SODIUM CHLORIDE 0.9 % IV SOLN
1000.0000 mg | Freq: Once | INTRAVENOUS | Status: DC
  Filled 2012-03-22 (×2): qty 20

## 2012-03-22 MED ORDER — DIPHENHYDRAMINE HCL 50 MG/ML IJ SOLN
INTRAMUSCULAR | Status: AC
  Administered 2012-03-22: 25 mg via INTRAVENOUS
  Filled 2012-03-22: qty 1

## 2012-03-22 MED ORDER — SODIUM CHLORIDE 0.9 % IV SOLN
25.0000 mg | Freq: Once | INTRAVENOUS | Status: AC
  Administered 2012-03-22: 25 mg via INTRAVENOUS
  Filled 2012-03-22: qty 0.5

## 2012-03-22 MED ORDER — DIPHENHYDRAMINE HCL 50 MG/ML IJ SOLN
25.0000 mg | Freq: Once | INTRAMUSCULAR | Status: AC
  Administered 2012-03-22: 25 mg via INTRAVENOUS

## 2012-03-22 MED ORDER — MAGNESIUM SULFATE 40 MG/ML IJ SOLN
2.0000 g | Freq: Once | INTRAMUSCULAR | Status: AC
  Administered 2012-03-22: 2 g via INTRAVENOUS
  Filled 2012-03-22: qty 50

## 2012-03-22 MED ORDER — SODIUM CHLORIDE 0.9 % IV SOLN
1020.0000 mg | Freq: Once | INTRAVENOUS | Status: AC
  Administered 2012-03-22: 1020 mg via INTRAVENOUS
  Filled 2012-03-22: qty 34

## 2012-03-22 MED ORDER — POTASSIUM CHLORIDE CRYS ER 20 MEQ PO TBCR
40.0000 meq | EXTENDED_RELEASE_TABLET | Freq: Once | ORAL | Status: AC
  Administered 2012-03-22: 40 meq via ORAL
  Filled 2012-03-22: qty 2

## 2012-03-22 NOTE — Progress Notes (Signed)
Inpatient Diabetes Program Recommendations  AACE/ADA: New Consensus Statement on Inpatient Glycemic Control (2013)  Target Ranges:  Prepandial:   less than 140 mg/dL      Peak postprandial:   less than 180 mg/dL (1-2 hours)      Critically ill patients:  140 - 180 mg/dL   Reason for Visit: No diagnosis of diabetes yet, but Hgb A1C is 6.3  Inpatient Diabetes Program Recommendations HgbA1C: Hgb A1C is 6.3 Outpatient Referral: Daughter, Aundria Rud is receptive to [facility].  Wants to find out what his co-pay will be before she makes an appointment.   Note: Patient is sleeping soundly-- had received Benadryl to treat a possible medication reaction and has been sleeping a lot since.  Daughter, Rajini Ni and her husband are present and very receptive to my visit.  Daughter states that the patient sleeps a lot anyway-- as did his father before him.  Daughter states that patient has been told of some slight elevations in blood glucose in the past (at least once at a diabetes screening, etc.) but has never been diagnosed with diabetes.  Daughter questioning if discoloration in his skin about his face could be related to elevated blood glucose.  Patient sleeping, so did not check for acanthosis nigricans.    States they eat a lot of fresh foods- mostly cooked- and he is probably about 90% vegetarian.  Discussed need to be aware of high CHO containing foods so intake of these foods can be controlled to help with glycemic control.  Will place Dietitian Consult.  Will also place a 1:1 outpatient consult to [facility] for nutrition education for possible pre-diabetes.  Daughter wants to check to see what his co-pay would be for education before they make an appointment.  Will need 1:1 education due need to address his particular culture.  Thank you.  Ardyn Forge S. Elsie Lincoln, RN, CNS, CDE Inpatient Diabetes Program, team pager [phone]

## 2012-03-22 NOTE — ED Provider Notes (Signed)
Medical screening examination/treatment/procedure(s) were performed by non-physician practitioner and as supervising physician I was immediately available for consultation/collaboration.  Jerie Basford T Efrem Pitstick, MD 03/22/12 1517 

## 2012-03-22 NOTE — Progress Notes (Signed)
03/22/12 1005  Vitals  Temp 99.2 F (37.3 C)  Temp src Oral  BP 124/72 mmHg  BP Location Left arm  BP Method Automatic  Patient Position, if appropriate Lying  Pulse Rate ! 120  Pulse Rate Source Dinamap  Oxygen Therapy  SpO2 97 %  O2 Device None (Room air)  patient had reaction to test dose of infed. Dr notified. New orders received. Will not give full dose Infed.

## 2012-03-22 NOTE — Evaluation (Signed)
Physical Therapy Evaluation Patient Details Name: Jonathon Cannon MRN: [id number] DOB: [birth year] Today's Date: 03/22/2012 Time: 0825-0840 PT Time Calculation (min): 15 min  PT Assessment / Plan / Recommendation Clinical Impression  Pt. is a 62 y/o male admitted with progressive weakness; found to have UTI.  Pt. near baseline for mobility and feel pt. can safely d/c home with family supervision.    PT Assessment  Patient needs continued PT services    Follow Up Recommendations  No PT follow up    Does the patient have the potential to tolerate intense rehabilitation      Barriers to Discharge None      Equipment Recommendations  None recommended by PT    Recommendations for Other Services     Frequency Min 3X/week    Precautions / Restrictions Precautions Precautions: None Restrictions Weight Bearing Restrictions: No   Pertinent Vitals/Pain Pt. Reported R foot pain with ambulation.  Pt stated pain occurs when it is cold outside and reports chronic pain.      Mobility  Bed Mobility Bed Mobility: Supine to Sit Supine to Sit: 7: Independent Transfers Transfers: Sit to Stand;Stand to Sit Sit to Stand: 7: Independent;From bed Stand to Sit: 7: Independent;To bed Ambulation/Gait Ambulation/Gait Assistance: 4: Min guard Ambulation Distance (Feet): 200 Feet Assistive device: None;Other (Comment) (utilized IV pole x 100') Ambulation/Gait Assistance Details: increased antalgic gait without IV pole for assistance Gait Pattern: Antalgic (due to R foot pain) Stairs: No Wheelchair Mobility Wheelchair Mobility: No    Exercises     PT Diagnosis: Difficulty walking;Abnormality of gait;Acute pain  PT Problem List: Decreased activity tolerance;Decreased mobility;Pain PT Treatment Interventions: Gait training;Stair training;Functional mobility training;Therapeutic activities;Therapeutic exercise;Balance training;Neuromuscular re-education;Patient/family education   PT  Goals Acute Rehab PT Goals PT Goal Formulation: With patient Time For Goal Achievement: 03/29/12 Potential to Achieve Goals: Good Pt will go Sit to Supine/Side: Independently;with HOB 0 degrees PT Goal: Sit to Supine/Side - Progress: Goal set today Pt will Ambulate: >150 feet;Independently PT Goal: Ambulate - Progress: Goal set today Pt will Go Up / Down Stairs: Flight;with modified independence;with rail(s) PT Goal: Up/Down Stairs - Progress: Goal set today  Visit Information  Last PT Received On: 03/22/12 Assistance Needed: +1    Subjective Data  Subjective: "I feel better." Patient Stated Goal: to go home   Prior Functioning  Home Living Lives With: Family;Spouse;Daughter Available Help at Discharge: Family;Available 24 hours/day Type of Home: House Home Access: Stairs to enter [employer] of Steps: 2 Entrance Stairs-Rails: None Home Layout: Two level;1/2 bath on main level Alternate Level Stairs-Number of Steps: 13 Alternate Level Stairs-Rails: Right Home Adaptive Equipment: None Prior Function Level of Independence: Independent Able to Take Stairs?: Yes Vocation: Retired Musician: Prefers language other than English;No difficulties (son-in-law present to assist with language barrier)    Cognition  Cognition Overall Cognitive Status: Appears within functional limits for tasks assessed/performed Arousal/Alertness: Awake/alert Orientation Level: Appears intact for tasks assessed Behavior During Session: [facility] for tasks performed    Extremity/Trunk Assessment Right Lower Extremity Assessment RLE ROM/Strength/Tone: Within functional levels RLE Coordination: WFL - gross motor Left Lower Extremity Assessment LLE ROM/Strength/Tone: Within functional levels LLE Coordination: WFL - gross motor Trunk Assessment Trunk Assessment: Normal   Balance Balance Balance Assessed: Yes Static Standing Balance Static Standing - Level of Assistance:  5: Stand by assistance  End of Session PT - End of Session Equipment Utilized During Treatment: Gait belt Activity Tolerance: Patient tolerated treatment well Patient left: in chair;with family/visitor  present (W/C transport to ultrasound) Nurse Communication: Mobility status  GP Functional Assessment Tool Used: clinical judgement Functional Limitation: Mobility: Walking and moving around Mobility: Walking and Moving Around Current Status ([id number]): At least 1 percent but less than 20 percent impaired, limited or restricted Mobility: Walking and Moving Around Goal Status [phone]): 0 percent impaired, limited or restricted   Timoteo Gaul 03/22/2012, 8:50 AM  Timoteo Gaul, PT, DPT [phone]

## 2012-03-22 NOTE — Care Management Note (Signed)
    Page 1 of 1   03/24/2012     10:49:28 AM   CARE MANAGEMENT NOTE 03/24/2012  Patient:  Jonathon Cannon,Jonathon Cannon   Account Number:  [id number]  Date Initiated:  03/22/2012  Documentation initiated by:  Letha Cape  Subjective/Objective Assessment:   dx uti  admit- lives with spouse, pta indep.     Action/Plan:   Anticipated DC Date:  03/24/2012   Anticipated DC Plan:  HOME/SELF CARE      DC Planning Services  CM consult      Choice offered to / List presented to:             Status of service:  Completed, signed off Medicare Important Message given?   (If response is "NO", the following Medicare IM given date fields will be blank) Date Medicare IM given:   Date Additional Medicare IM given:    Discharge Disposition:  HOME/SELF CARE  Per UR Regulation:  Reviewed for med. necessity/level of care/duration of stay  If discussed at Long Length of Stay Meetings, dates discussed:    Comments:  PCP Dr. Neale Burly [phone]: Contact (daughter) Rajni [phone]  03/24/12 10:48 Letha Cape RN, BSN [phone] patient is for discharge today.  03/22/12 14:37 Letha Cape RN, BSN [phone] patient lives with spouse, pta indep.  Patient has medication coverage and transportation at discharge.  No needs anticipated.

## 2012-03-22 NOTE — Progress Notes (Signed)
[facility]                                                                                Patient Demographics  Jonathon Cannon, is a 62 y.o. male, DOB - 02/10/1950, ZOX:[id number], [id number]  Admit date - 03/21/2012  Admitting Physician Jonathon Sea, MD  Outpatient Primary MD for the patient is Jonathon Cannon, Provider, MD  LOS - 1   No chief complaint on file.       Assessment & Plan     1. Fevers, lower leg aches acute on chronic, UTI along with uncontrolled hypothyroidism, anemia - he she will be admitted to the hospital, blood and urine cultures will be obtained, will check  abd ultrasound to rule out prostatic obstruction, PSA levels stable, follow temperature curve, Levaquin to be dosed by pharmacy, IV fluids, since his leg aches are worse we'll check CK levels. Follow influ panel (symptoms there for 2 weeks).     2. Poorly controlled hypothyroidism with TSH in mid 20s. Will increase home dose Synthroid, repeat TSH free T3 in 4-6 weeks outpatient.      3. Cirrhosis noted on CT scan incidentally. With some splenomegaly, thrombocytopenia, mildly elevated INR  -  Patient denies any history of hepatitis, no history of significant alcohol use. This could represent Jonathon Cannon. Will check acute hepatitis panel, abdominal ultrasound, outpatient GI followup post discharge.       4. Normocytic anemia ( withIron and vitamin B12 deficiency). Some fall due to IV fluids causing dilution, will replace iron IV, will start him on IM B12 shots, check Hemoccults to do an outpatient GI followup if appropriate for iron deficiency workup, place him on PPI for now. He has outpatient followup with Jonathon Cannon will follow with him post discharge.      5. Elevated sugars. No previous history of diabetes mellitus, borderline A1c, we'll place him on low-dose sliding scale insulin and carb modified diet.       Code Status: Full  Family Communication: Discussed with the patient  and family  Disposition Plan: Home   Procedures CT abdomen pelvis, ultrasound abdomen, peripheral smear   Consults -   DVT Prophylaxis  SCDs   Lab Results  Component Value Date   PLT 60* 03/22/2012    Medications  Scheduled Meds: . cyanocobalamin  1,000 mcg Intramuscular Daily  . influenza  inactive virus vaccine  0.5 mL Intramuscular Tomorrow-1000  . insulin aspart  0-9 Units Subcutaneous TID WC  . iron dextran (INFED/DEXFERRUM) 1000 MG IVPB  1,000 mg Intravenous Once  . iron dextran (INFED/DEXFERRUM) IVPB (TEST DOSE)  25 mg Intravenous Once  . levofloxacin (LEVAQUIN) IV  500 mg Intravenous Q24H  . levothyroxine  150 mcg Oral QAC breakfast  . magnesium sulfate 1 - 4 g bolus IVPB  2 g Intravenous Once  . multivitamin with minerals  1 tablet Oral Daily  . pantoprazole  40 mg Oral Daily  . potassium chloride  40 mEq Oral Once  . senna-docusate  2 tablet Oral QHS   Continuous Infusions: . 0.9 % NaCl with KCl 40 mEq / L     PRN Meds:.albuterol, alum & mag hydroxide-simeth, guaiFENesin-dextromethorphan, HYDROcodone-acetaminophen,  ondansetron (ZOFRAN) IV, ondansetron, polyethylene glycol, zolpidem  Antibiotics    Anti-infectives   Start     Dose/Rate Route Frequency Ordered Stop   03/21/12 2000  levofloxacin (LEVAQUIN) IVPB 500 mg     500 mg 100 mL/hr over 60 Minutes Intravenous Every 24 hours 03/21/12 1938     03/21/12 1445  ciprofloxacin (CIPRO) IVPB 400 mg     400 mg 200 mL/hr over 60 Minutes Intravenous  Once 03/21/12 1431 03/21/12 1701       Time Spent in minutes   35   Jonathon Cannon M.D on 03/22/2012 at 7:48 AM  Between 7am to 7pm - Pager - [phone]  After 7pm go to [email or web address] - password [email or web address]  And look for the night coverage person covering for me after hours  [facility] Office  [phone]    Subjective:   Jonathon Cannon today has, No headache, No chest pain, No abdominal pain - No Nausea, No new weakness tingling or numbness,  No Cough - SOB. Has some generalized weakness but feels better.  Objective:   Filed Vitals:   03/21/12 1845 03/21/12 1915 03/21/12 2000 03/22/12 0558  BP: 155/77 150/89 154/85 146/81  Pulse: 97 94 94 82  Temp:   99.4 F (37.4 C) 98.6 F (37 C)  TempSrc:   Oral Oral  Resp: 23 23 22 22   Height:   5' 5.9" (1.674 m)   Weight:   81.2 kg (179 lb 0.2 oz) 81.1 kg (178 lb 12.7 oz)  SpO2: 99% 100% 98% 98%    Wt Readings from Last 3 Encounters:  03/22/12 81.1 kg (178 lb 12.7 oz)     Intake/Output Summary (Last 24 hours) at 03/22/12 0748 Last data filed at 03/22/12 0600  Gross per 24 hour  Intake 1218.75 ml  Output   2100 ml  Net -881.25 ml    Exam Awake Alert, Oriented X 3, No new F.N deficits, Normal affect Jonathon Cannon.AT,PERRAL Supple Neck,No JVD, No cervical lymphadenopathy appriciated.  Symmetrical Chest wall movement, Good air movement bilaterally, CTAB RRR,No Gallops,Rubs or new Murmurs, No Parasternal Heave +ve B.Sounds, Abd Soft, Non tender, No organomegaly appriciated, No rebound - guarding or rigidity. No Cyanosis, Clubbing or edema, No new Rash or bruise      Data Review   Micro Results No results found for this or any previous visit (from the past 240 hour(s)).  Radiology Reports Dg Chest 2 View  03/21/2012  *RADIOLOGY REPORT*  Clinical Data: cough  CHEST - 2 VIEW  Comparison: 03/19/2012  Findings: Normal heart size.  No pleural effusion or edema.  There is no airspace consolidation identified.  Spondylosis noted within the thoracic spine.  IMPRESSION:  1.  No acute cardiopulmonary abnormalities.   Original Report Authenticated By: Jonathon Cannon, M.D.    Dg Chest 2 View  03/19/2012  *RADIOLOGY REPORT*  Clinical Data: Fever and weakness for 2 days.  CHEST - 2 VIEW  Comparison: None.  Findings: The lungs are clear and fully expanded.  No infiltrates or masses.  No effusions or pneumothoraces.  The aorta is tortuous. The heart and mediastinal structures are normal.  There are  no acute bony changes.  IMPRESSION: No active disease.   Original Report Authenticated By: Jonathon Cannon, M.D.    Ct Head Wo Contrast  03/20/2012  *RADIOLOGY REPORT*  Clinical Data: Body aches and weakness; confusion.  Slurred speech.  CT HEAD WITHOUT CONTRAST  Technique:  Contiguous axial images were obtained from the base of the  skull through the vertex without contrast.  Comparison: None.  Findings: There is no evidence of acute infarction, mass lesion, or intra- or extra-axial hemorrhage on CT.  The posterior fossa, including the cerebellum, brainstem and fourth ventricle, is within normal limits.  The third and lateral ventricles, and basal ganglia are unremarkable in appearance.  The cerebral hemispheres are symmetric in appearance, with normal gray- white differentiation.  No mass effect or midline shift is seen.  There is no evidence of fracture; visualized osseous structures are unremarkable in appearance.  The orbits are within normal limits. A mucus retention cyst or polyp is noted at the left maxillary sinus, and mild mucosal thickening is noted at the right maxillary sinus.  There is partial opacification of the ethmoid air cells. The The remaining paranasal sinuses and mastoid air cells are well- aerated.  No significant soft tissue abnormalities are seen.  IMPRESSION:  1.  No acute intracranial pathology seen on CT. 2.  Mucus retention cyst or polyp at the left maxillary sinus, and mild mucosal thickening at the right maxillary sinus.   Original Report Authenticated By: Tonia Ghent, M.D.    Ct Abdomen Pelvis W Contrast  03/21/2012  *RADIOLOGY REPORT*  Clinical Data: Fever, abdominal pain.  CT ABDOMEN AND PELVIS WITH CONTRAST  Technique:  Multidetector CT imaging of the abdomen and pelvis was performed following the standard protocol during bolus administration of intravenous contrast.  Contrast: OMNIPAQUE IOHEXOL 300 MG/ML  SOLN  Comparison: None.  Findings: Visualized lung bases  clear.  Liver has a nodular contour, without focal lesion or intrahepatic biliary ductal dilatation.  Gallbladder is nondistended.  Splenomegaly, 16.2 cm craniocaudal length.  Unremarkable adrenal glands, pancreas, kidneys.  Patchy aortoiliac plaque without aneurysm.  Stomach, small bowel, and colon are nondilated.  Normal appendix.  No ascites.  Urinary bladder incompletely distended.  Bilateral pelvic phleboliths.  No of free air.  Mild spondylitic changes in the lower lumbar spine.  IMPRESSION:  1.  Nodular liver contour suggesting cirrhosis, with splenomegaly but no other stigmata of portal venous hypertension. 2.  No acute abdominal process.   Original Report Authenticated By: D. Andria Rhein, MD     CBC  Recent Labs Lab 03/19/12 2345 03/21/12 1246 03/22/12 0530  WBC 3.7* 5.0 4.6  HGB 9.5* 9.9* 8.6*  HCT 28.6* 29.2* 26.2*  PLT 68* 67* 60*  MCV 84.9 83.9 83.7  MCH 28.2 28.4 27.5  MCHC 33.2 33.9 32.8  RDW 15.3 15.4 15.5  LYMPHSABS 1.0 1.6  --   MONOABS 0.9 0.7  --   EOSABS 0.1 0.0  --   BASOSABS 0.0 0.0  --     Chemistries   Recent Labs Lab 03/19/12 2345 03/21/12 1246 03/21/12 1929 03/22/12 0530  NA 137 136  --  139  Cannon 3.6 3.3*  --  3.3*  CL 104 102  --  107  CO2 24 24  --  24  GLUCOSE 110* 204*  --  113*  BUN 9 11  --  11  CREATININE 0.78 0.95  --  0.85  CALCIUM 8.4 8.4  --  7.9*  MG  --   --  1.7  --   AST  --  49*  --  37  ALT  --  25  --  19  ALKPHOS  --  128*  --  107  BILITOT  --  1.1  --  0.9   ------------------------------------------------------------------------------------------------------------------ estimated creatinine clearance is 93.4 ml/min (by C-G formula based  on Cr of 0.85). ------------------------------------------------------------------------------------------------------------------  Recent Labs  03/21/12 1929  HGBA1C 6.3*    ------------------------------------------------------------------------------------------------------------------ No results found for this basename: CHOL, HDL, LDLCALC, TRIG, CHOLHDL, LDLDIRECT,  in the last 72 hours ------------------------------------------------------------------------------------------------------------------  Recent Labs  03/19/12 2345  TSH 23.742*   ------------------------------------------------------------------------------------------------------------------  Recent Labs  03/21/12 1929  VITAMINB12 191*  FOLATE 15.9  FERRITIN 19*  TIBC 374  IRON 20*  RETICCTPCT 1.9    Coagulation profile  Recent Labs Lab 03/21/12 1929  INR 1.54*    No results found for this basename: DDIMER,  in the last 72 hours  Cardiac Enzymes No results found for this basename: CK, CKMB, TROPONINI, MYOGLOBIN,  in the last 168 hours ------------------------------------------------------------------------------------------------------------------ No components found with this basename: POCBNP,

## 2012-03-23 LAB — URINE CULTURE: Colony Count: >=100000

## 2012-03-23 LAB — GLUCOSE, CAPILLARY
Glucose-Capillary: 89 mg/dL (ref 70–99)
Glucose-Capillary: 144 mg/dL — ABNORMAL HIGH (ref 70–99)

## 2012-03-23 MED ORDER — INFLUENZA VIRUS VACC SPLIT PF IM SUSP
0.5000 mL | Freq: Once | INTRAMUSCULAR | Status: AC
  Administered 2012-03-23: 0.5 mL via INTRAMUSCULAR
  Filled 2012-03-23: qty 0.5

## 2012-03-23 NOTE — Progress Notes (Signed)
[facility]                                                                                Patient Demographics  Jonathon Cannon, is a 62 y.o. male, DOB - 1950-10-01, QMV:[id number], MWU:[id number]  Admit date - 03/21/2012  Admitting Physician Leroy Sea, MD  Outpatient Primary MD for the patient is Default, Provider, MD  LOS - 2    Assessment & Plan    UTI -PSA levels stable, follow temperature curve, Levaquin to be dosed by pharmacy. -Needs antibiotics for about 7 days. Culture showed MSSA. -Blood cultures done x2, NGTD.  Hypothyroidism -Poorly controlled with TSH in mid 20s, Synthroid dose increased. -TSH sees to be checked within 4-6 weeks as outpatient. -We'll follow clinically and see evidence of make any difference as his energy level.  Cirrhosis -CT scan incidentally. With some splenomegaly, thrombocytopenia, mildly elevated INR  -Patient denies any history of hepatitis, no history of significant alcohol use.  -This could represent Elita Boone. Will check acute hepatitis panel, abdominal ultrasound, outpatient GI followup post discharge.   Anemia -Has both iron and vitamin B12 deficiency. Had IV iron and IM B12. -Check Hemoccults to do an outpatient GI followup if appropriate for iron deficiency workup, place him on PPI for now.   Elevated blood sugar  -No previous history of diabetes mellitus, borderline A1c, we'll place him on low-dose sliding scale insulin and carb modified diet.  -His glucose today is 113, this is likely consistent with impaired fasting glucose. -IFG can cause peripheral neuropathy.      Code Status: Full Family Communication: Discussed with the patient and family Disposition Plan: Home   Procedures CT abdomen pelvis, ultrasound abdomen, peripheral smear   Consults -   DVT Prophylaxis  SCDs   Lab Results  Component Value Date   PLT 60* 03/22/2012    Medications  Scheduled Meds: . cyanocobalamin  1,000 mcg  Intramuscular Daily  . insulin aspart  0-9 Units Subcutaneous TID WC  . levofloxacin (LEVAQUIN) IV  500 mg Intravenous Q24H  . levothyroxine  150 mcg Oral QAC breakfast  . multivitamin with minerals  1 tablet Oral Daily  . pantoprazole  40 mg Oral Daily  . senna-docusate  2 tablet Oral QHS   Continuous Infusions:   PRN Meds:.albuterol, alum & mag hydroxide-simeth, guaiFENesin-dextromethorphan, HYDROcodone-acetaminophen, ondansetron (ZOFRAN) IV, ondansetron, polyethylene glycol, zolpidem  Antibiotics    Anti-infectives   Start     Dose/Rate Route Frequency Ordered Stop   03/22/12 1130  levofloxacin (LEVAQUIN) IVPB 500 mg     500 mg 100 mL/hr over 60 Minutes Intravenous Every 24 hours 03/22/12 1005     03/21/12 2000  levofloxacin (LEVAQUIN) IVPB 500 mg  Status:  Discontinued     500 mg 100 mL/hr over 60 Minutes Intravenous Every 24 hours 03/21/12 1938 03/22/12 1005   03/21/12 1445  ciprofloxacin (CIPRO) IVPB 400 mg     400 mg 200 mL/hr over 60 Minutes Intravenous  Once 03/21/12 1431 03/21/12 1701       Time Spent in minutes   35   Haron Beilke A M.D on 03/23/2012 at 11:31 AM  Between 7am to 7pm - Pager -  [phone]  After 7pm go to [email or web address] - password [email or web address]  And look for the night coverage person covering for me after hours  [facility] Office  [phone]    Subjective:   Jonathon Cannon today has, No headache, No chest pain, No abdominal pain - No Nausea, No new weakness tingling or numbness, No Cough - SOB. Has some generalized weakness but feels better.  Objective:   Filed Vitals:   03/22/12 1005 03/22/12 1417 03/22/12 2058 03/23/12 0518  BP: 124/72 127/79 143/79 144/87  Pulse: 120 88 92 88  Temp: 99.2 F (37.3 C) 98.5 F (36.9 C) 99 F (37.2 C) 98.4 F (36.9 C)  TempSrc: Oral Oral Oral Oral  Resp:  20 18 20   Height:      Weight:    83 kg (182 lb 15.7 oz)  SpO2: 97% 97% 97% 96%    Wt Readings from Last 3 Encounters:  03/23/12 83 kg  (182 lb 15.7 oz)     Intake/Output Summary (Last 24 hours) at 03/23/12 1131 Last data filed at 03/23/12 0900  Gross per 24 hour  Intake   2785 ml  Output   2851 ml  Net    -66 ml    Exam Awake Alert, Oriented X 3, No new F.N deficits, Normal affect [place].AT,PERRAL Supple Neck,No JVD, No cervical lymphadenopathy appriciated.  Symmetrical Chest wall movement, Good air movement bilaterally, CTAB RRR,No Gallops,Rubs or new Murmurs, No Parasternal Heave +ve B.Sounds, Abd Soft, Non tender, No organomegaly appriciated, No rebound - guarding or rigidity. No Cyanosis, Clubbing or edema, No new Rash or bruise      Data Review   Micro Results Recent Results (from the past 240 hour(s))  URINE CULTURE     Status: None   Collection Time    03/21/12  1:00 PM      Result Value Range Status   Specimen Description URINE, CLEAN CATCH   Final   Special Requests NONE   Final   Culture  Setup Time 03/21/2012 15:28   Final   Colony Count >=100,000 COLONIES/ML   Final   Culture     Final   Value: STAPHYLOCOCCUS AUREUS     Note: RIFAMPIN AND GENTAMICIN SHOULD NOT BE USED AS SINGLE DRUGS FOR TREATMENT OF STAPH INFECTIONS.   Report Status 03/23/2012 FINAL   Final   Organism ID, Bacteria STAPHYLOCOCCUS AUREUS   Final  CULTURE, BLOOD (ROUTINE X 2)     Status: None   Collection Time    03/21/12  6:50 PM      Result Value Range Status   Specimen Description BLOOD ARM RIGHT   Final   Special Requests BOTTLES DRAWN AEROBIC AND ANAEROBIC 10CC   Final   Culture  Setup Time 03/22/2012 00:32   Final   Culture     Final   Value:        BLOOD CULTURE RECEIVED NO GROWTH TO DATE CULTURE WILL BE HELD FOR 5 DAYS BEFORE ISSUING A FINAL NEGATIVE REPORT   Report Status PENDING   Incomplete  CULTURE, BLOOD (ROUTINE X 2)     Status: None   Collection Time    03/21/12  7:05 PM      Result Value Range Status   Specimen Description BLOOD HAND RIGHT   Final   Special Requests BOTTLES DRAWN AEROBIC ONLY 10CC   Final    Culture  Setup Time 03/22/2012 00:33   Final   Culture     Final  Value:        BLOOD CULTURE RECEIVED NO GROWTH TO DATE CULTURE WILL BE HELD FOR 5 DAYS BEFORE ISSUING A FINAL NEGATIVE REPORT   Report Status PENDING   Incomplete    Radiology Reports Dg Chest 2 View  03/21/2012  *RADIOLOGY REPORT*  Clinical Data: cough  CHEST - 2 VIEW  Comparison: 03/19/2012  Findings: Normal heart size.  No pleural effusion or edema.  There is no airspace consolidation identified.  Spondylosis noted within the thoracic spine.  IMPRESSION:  1.  No acute cardiopulmonary abnormalities.   Original Report Authenticated By: Signa Kell, M.D.    Dg Chest 2 View  03/19/2012  *RADIOLOGY REPORT*  Clinical Data: Fever and weakness for 2 days.  CHEST - 2 VIEW  Comparison: None.  Findings: The lungs are clear and fully expanded.  No infiltrates or masses.  No effusions or pneumothoraces.  The aorta is tortuous. The heart and mediastinal structures are normal.  There are no acute bony changes.  IMPRESSION: No active disease.   Original Report Authenticated By: Sander Radon, M.D.    Ct Head Wo Contrast  03/20/2012  *RADIOLOGY REPORT*  Clinical Data: Body aches and weakness; confusion.  Slurred speech.  CT HEAD WITHOUT CONTRAST  Technique:  Contiguous axial images were obtained from the base of the skull through the vertex without contrast.  Comparison: None.  Findings: There is no evidence of acute infarction, mass lesion, or intra- or extra-axial hemorrhage on CT.  The posterior fossa, including the cerebellum, brainstem and fourth ventricle, is within normal limits.  The third and lateral ventricles, and basal ganglia are unremarkable in appearance.  The cerebral hemispheres are symmetric in appearance, with normal gray- white differentiation.  No mass effect or midline shift is seen.  There is no evidence of fracture; visualized osseous structures are unremarkable in appearance.  The orbits are within normal limits. A  mucus retention cyst or polyp is noted at the left maxillary sinus, and mild mucosal thickening is noted at the right maxillary sinus.  There is partial opacification of the ethmoid air cells. The The remaining paranasal sinuses and mastoid air cells are well- aerated.  No significant soft tissue abnormalities are seen.  IMPRESSION:  1.  No acute intracranial pathology seen on CT. 2.  Mucus retention cyst or polyp at the left maxillary sinus, and mild mucosal thickening at the right maxillary sinus.   Original Report Authenticated By: Tonia Ghent, M.D.    Ct Abdomen Pelvis W Contrast  03/21/2012  *RADIOLOGY REPORT*  Clinical Data: Fever, abdominal pain.  CT ABDOMEN AND PELVIS WITH CONTRAST  Technique:  Multidetector CT imaging of the abdomen and pelvis was performed following the standard protocol during bolus administration of intravenous contrast.  Contrast: OMNIPAQUE IOHEXOL 300 MG/ML  SOLN  Comparison: None.  Findings: Visualized lung bases clear.  Liver has a nodular contour, without focal lesion or intrahepatic biliary ductal dilatation.  Gallbladder is nondistended.  Splenomegaly, 16.2 cm craniocaudal length.  Unremarkable adrenal glands, pancreas, kidneys.  Patchy aortoiliac plaque without aneurysm.  Stomach, small bowel, and colon are nondilated.  Normal appendix.  No ascites.  Urinary bladder incompletely distended.  Bilateral pelvic phleboliths.  No of free air.  Mild spondylitic changes in the lower lumbar spine.  IMPRESSION:  1.  Nodular liver contour suggesting cirrhosis, with splenomegaly but no other stigmata of portal venous hypertension. 2.  No acute abdominal process.   Original Report Authenticated By: D. Andria Rhein, MD  CBC  Recent Labs Lab 03/19/12 2345 03/21/12 1246 03/22/12 0530  WBC 3.7* 5.0 4.6  HGB 9.5* 9.9* 8.6*  HCT 28.6* 29.2* 26.2*  PLT 68* 67* 60*  MCV 84.9 83.9 83.7  MCH 28.2 28.4 27.5  MCHC 33.2 33.9 32.8  RDW 15.3 15.4 15.5  LYMPHSABS 1.0 1.6  --    MONOABS 0.9 0.7  --   EOSABS 0.1 0.0  --   BASOSABS 0.0 0.0  --     Chemistries   Recent Labs Lab 03/19/12 2345 03/21/12 1246 03/21/12 1929 03/22/12 0530  NA 137 136  --  139  K 3.6 3.3*  --  3.3*  CL 104 102  --  107  CO2 24 24  --  24  GLUCOSE 110* 204*  --  113*  BUN 9 11  --  11  CREATININE 0.78 0.95  --  0.85  CALCIUM 8.4 8.4  --  7.9*  MG  --   --  1.7  --   AST  --  49*  --  37  ALT  --  25  --  19  ALKPHOS  --  128*  --  107  BILITOT  --  1.1  --  0.9   ------------------------------------------------------------------------------------------------------------------ estimated creatinine clearance is 94.5 ml/min (by C-G formula based on Cr of 0.85). ------------------------------------------------------------------------------------------------------------------  Recent Labs  03/21/12 1929  HGBA1C 6.3*   ------------------------------------------------------------------------------------------------------------------ No results found for this basename: CHOL, HDL, LDLCALC, TRIG, CHOLHDL, LDLDIRECT,  in the last 72 hours ------------------------------------------------------------------------------------------------------------------ No results found for this basename: TSH, T4TOTAL, FREET3, T3FREE, THYROIDAB,  in the last 72 hours ------------------------------------------------------------------------------------------------------------------  Recent Labs  03/21/12 1929  VITAMINB12 191*  FOLATE 15.9  FERRITIN 19*  TIBC 374  IRON 20*  RETICCTPCT 1.9    Coagulation profile  Recent Labs Lab 03/21/12 1929  INR 1.54*    No results found for this basename: DDIMER,  in the last 72 hours  Cardiac Enzymes No results found for this basename: CK, CKMB, TROPONINI, MYOGLOBIN,  in the last 168 hours ------------------------------------------------------------------------------------------------------------------ No components found with this basename:  POCBNP,

## 2012-03-23 NOTE — ED Provider Notes (Signed)
Medical screening examination/treatment/procedure(s) were performed by non-physician practitioner and as supervising physician I was immediately available for consultation/collaboration.   Richardean Canal, MD 03/23/12 [phone]

## 2012-03-23 NOTE — Plan of Care (Signed)
Problem: Food- and Nutrition-Related Knowledge Deficit (NB-1.1) Goal: Nutrition education Formal process to instruct or train a patient/client in a skill or to impart knowledge to help patients/clients voluntarily manage or modify food choices and eating behavior to maintain or improve health. Outcome: Completed/Met Date Met:  03/23/12 Nutrition Education Note:   RD consulted for nutrition education regarding diabetes. Pt with slightly elevated A1c-indicating increased risk for developing DM. Pt limits meat intake to 2-3 times per week. Pt has referral to [facility] for out patient education. RD encouraged pt to attend.     Lab Results  Component Value Date    HGBA1C 6.3* 03/21/2012    RD provided "General Healthful Vegetarian Nutrition" handout from the Academy of [employer]. Discussed eating a balanced diet including fruits and vegetables, lean protein from animal and plant sources, whole grains, and decreasing amount of sugar sweetened beverages and simple sugars.  Teach back method used.  Expect good compliance.  Body mass index is 29.62 kg/(m^2). Pt meets criteria for overweight based on current BMI.  Current diet order is Heart-low carbohydrate, patient is consuming approximately 100% of meals at this time. Labs and medications reviewed. No further nutrition interventions warranted at this time. RD contact information provided. If additional nutrition issues arise, please re-consult RD.  Clarene Duke RD, LDN Pager [phone] After Hours pager [phone]

## 2012-03-23 NOTE — Progress Notes (Signed)
Physical Therapy Treatment Patient Details Name: Jonathon Cannon MRN: [id number] DOB: November 20, 1950 Today's Date: 03/23/2012 Time: 4696-2952 PT Time Calculation (min): 12 min  PT Assessment / Plan / Recommendation Comments on Treatment Session  Pt adm with weakness and found to have UTI.  Pt with improving mobility. Encouraged pt to amb in hallways with family.    Follow Up Recommendations  No PT follow up     Does the patient have the potential to tolerate intense rehabilitation     Barriers to Discharge        Equipment Recommendations  None recommended by PT    Recommendations for Other Services    Frequency Min 3X/week   Plan Discharge plan remains appropriate;Frequency remains appropriate    Precautions / Restrictions Precautions Precautions: None   Pertinent Vitals/Pain Pt states foot feels better today.    Mobility  Bed Mobility Supine to Sit: 7: Independent Transfers Sit to Stand: 7: Independent;From bed Stand to Sit: 7: Independent;To bed Ambulation/Gait Ambulation/Gait Assistance: 5: Supervision Ambulation Distance (Feet): 300 Feet Assistive device: None Gait Pattern: Step-through pattern;Decreased stride length    Exercises     PT Diagnosis:    PT Problem List:   PT Treatment Interventions:     PT Goals Acute Rehab PT Goals PT Goal: Ambulate - Progress: Progressing toward goal  Visit Information  Last PT Received On: 03/23/12 Assistance Needed: +1    Subjective Data  Subjective: Pt states he feels better.   Cognition  Cognition Overall Cognitive Status: Appears within functional limits for tasks assessed/performed Arousal/Alertness: Awake/alert Orientation Level: Appears intact for tasks assessed Behavior During Session: [facility] for tasks performed    Balance     End of Session PT - End of Session Activity Tolerance: Patient tolerated treatment well Patient left: in bed;with call bell/phone within reach;with family/visitor present   GP      [facility] 03/23/2012, 11:18 AM  Arabia Nylund PT [phone]

## 2012-03-24 DIAGNOSIS — R7301 Impaired fasting glucose: Secondary | ICD-10-CM | POA: Diagnosis present

## 2012-03-24 LAB — CBC
Hemoglobin: 9 g/dL — ABNORMAL LOW (ref 13.0–17.0)
MCH: 28 pg (ref 26.0–34.0)
MCV: 83.5 fL (ref 78.0–100.0)
RBC: 3.21 MIL/uL — ABNORMAL LOW (ref 4.22–5.81)
WBC: 3.6 10*3/uL — ABNORMAL LOW (ref 4.0–10.5)

## 2012-03-24 LAB — BASIC METABOLIC PANEL
BUN: 9 mg/dL (ref 6–23)
Calcium: 8.4 mg/dL (ref 8.4–10.5)
Creatinine, Ser: 0.84 mg/dL (ref 0.50–1.35)
GFR calc non Af Amer: >90 mL/min (ref >90)
Glucose, Bld: 85 mg/dL (ref 70–99)
Potassium: 4.2 mEq/L (ref 3.5–5.1)

## 2012-03-24 MED ORDER — LEVOTHYROXINE SODIUM 150 MCG PO TABS: 150.0000 ug | ORAL_TABLET | Freq: Every day | ORAL | Status: DC

## 2012-03-24 MED ORDER — CYANOCOBALAMIN 100 MCG PO TABS: 100.0000 ug | ORAL_TABLET | Freq: Every day | ORAL | Status: DC

## 2012-03-24 MED ORDER — LEVOFLOXACIN 500 MG PO TABS: 500.0000 mg | ORAL_TABLET | Freq: Every day | ORAL | Status: DC

## 2012-03-24 NOTE — Discharge Summary (Signed)
Physician Discharge Summary  Jonathon Cannon ZOX:[id number] DOB: Jun 12, 1950 DOA: 03/21/2012  PCP: Jonathon Barre, MD  Admit date: 03/21/2012 Discharge date: 03/24/2012  Time spent: 40 minutes  Recommendations for Outpatient Follow-up:  1. Follow up with Dr Jonathon Cannon  Discharge Diagnoses:  Principal Problem:   UTI (lower urinary tract infection) Active Problems:   Hypertension   Hypothyroid   Pancytopenia   Cirrhosis   Thrombocytopenia   Anemia, chronic disease   Iron deficiency anemia   B12 deficiency   Impaired fasting glucose   Discharge Condition: Stable  Diet recommendation: Carbohydrate modified diet  Filed Weights   03/22/12 0558 03/23/12 0518 03/24/12 0500  Weight: 81.1 kg (178 lb 12.7 oz) 83 kg (182 lb 15.7 oz) 84.1 kg (185 lb 6.5 oz)    History of present illness:  Jonathon Cannon is a 62 y.o. male, with history of hypothyroidism, hypertension, neck lower extremity aches for several years, who was in his usual state of health till about 2 weeks ago when he started experiencing generalized weakness, worsening of his lower leg aches, along with fevers and chills, he presented to [facility] 2 days ago where he was given pain medications and anti-inflammatory medications for his fevers and sent home.  He continued to feel poorly and today presented to [facility] where he was diagnosed with UTI, elevated TSH in mid 20s, fevers of 101 and I was called to get the patient.  Hospital Course:    1. UTI: Emesis a UTI, patient presented to the hospital with fever and generalized weakness. His urinalysis was consistent with UTI. The culture grew staph aureus which is methicillin susceptible, started on Levaquin. The blood culture is done x2 in its negative to date. Patient will be on Levaquin for total of 10 days.  2. Hypothyroidism: Poorly controlled with TSH in the mid 20s. Synthroid dose increased from 100 mcg to 150 mcg daily, TSH should be checked in 4 weeks as outpatient. Patient  overall felt better, this is likely one of the things draining his energy level.  3. Hepatic cirrhosis: New diagnosis of hepatic cirrhosis, this was incidental findings in the CT scan, patient has also splenomegaly, thrombocytopenia, mildly elevated INR, hypoalbuminemia. But his liver disease is well compensated. He is been on Lasix 20 mg at home, to continue that. The cause of the cirrhosis is unclear, his ferritin is normal his hepatitis panel is negative. I think he'll benefit from outpatient gastroenterological followup.  4. Anemia: Anemia is likely chronic secondary to his chronic liver disease, anemia panel was done and showed low iron deficiency anemia and vitamin B12 deficiency. Patient had IV iron, and was discharged on oral iron supplements, he had intramuscular B12, oral supplements also prescribed at the time of discharge.  5. Elevated blood sugar: Patient has no previous history of diabetes mellitus type 2, he has hemoglobin A1c of 6.3 which is borderline, his fasting blood glucose was between 90 and 113. His relatively high CBD is consistent with impaired fasting glucose. IT can cause peripheral neuropathy without patient has full-blown diabetes. Patient was complaining about right lower extremity pain which might be related to that. He improved prior to discharge.  Procedures:  None  Consultations:  None  Discharge Exam: Filed Vitals:   03/23/12 1416 03/23/12 2120 03/24/12 0500 03/24/12 0533  BP: 138/87 119/82 160/95 131/78  Pulse: 89 91 87   Temp: 97.7 F (36.5 C) 99 F (37.2 C) 98.2 F (36.8 C)   TempSrc: Oral Oral Oral  Resp: 18 18 20    Height:      Weight:   84.1 kg (185 lb 6.5 oz)   SpO2: 98% 98% 100%    General: Alert and awake, oriented x3, not in any acute distress. HEENT: anicteric sclera, pupils reactive to light and accommodation, EOMI CVS: S1-S2 clear, no murmur rubs or gallops Chest: clear to auscultation bilaterally, no wheezing, rales or  rhonchi Abdomen: soft nontender, nondistended, normal bowel sounds, no organomegaly Extremities: no cyanosis, clubbing or edema noted bilaterally Neuro: Cranial nerves II-XII intact, no focal neurological deficits   Discharge Instructions  Discharge Orders   Future Appointments Provider Department Dept Phone   04/04/2012 9:30 AM [facility] [phone]   04/04/2012 9:45 AM Jonathon Cannon [facility] [phone]   04/04/2012 10:00 AM Jonathon Gaul, MD [place] [facility] [phone]   Future Orders Complete By Expires     Ambulatory referral to Nutrition and Diabetic Education  As directed     Comments:      Patient has had history of elevated glucose in the past (at a glucose screening, etc), but no history of diabetes or treatment.  Hbg A1C is 6.3.  Patient speaks some [place].  Please contact daughter, Cristine Polio at [phone] to set appointment.  She will want to attend with him.  She wants to check with his insurance to determine his co-payment before an appointment is made.  Needs 1:1 appointment due to language barrier.  Patient is 90% vegetarian.  Had a new-patient appointment with Dr. Neale Burly for 2/19, but will have to reschedule due to this hospitalization.  Will also be seeing a hematologist.        Medication List    STOP taking these medications       HYDROcodone-acetaminophen 10-325 MG per tablet  Commonly known as:  NORCO      TAKE these medications       CALCIUM & VIT D3 BONE HEALTH PO  Take 1 tablet by mouth daily.     furosemide 20 MG tablet  Commonly known as:  LASIX  Take 20 mg by mouth daily.     levofloxacin 500 MG tablet  Commonly known as:  LEVAQUIN  Take 1 tablet (500 mg total) by mouth daily.     levothyroxine 150 MCG tablet  Commonly known as:  SYNTHROID, LEVOTHROID  Take 1 tablet (150 mcg total) by mouth daily.     meloxicam 15  MG tablet  Commonly known as:  MOBIC  Take 15 mg by mouth daily as needed. For inflammation & pain     multivitamin with minerals tablet  Take 1 tablet by mouth daily.     PX IRON 27 MG Tabs  Generic drug:  Ferrous Sulfate  Take 1 tablet by mouth daily.     traMADol 50 MG tablet  Commonly known as:  ULTRAM  Take 50 mg by mouth 2 (two) times daily as needed for pain.     Vitamin D3 5000 UNITS Caps  Take 1 capsule by mouth daily.           Follow-up Information   Follow up with Jonathon Barre, MD In 1 week.   Contact information:   [address] [place] [address] [phone]        The results of significant diagnostics from this hospitalization (including imaging, microbiology, ancillary and laboratory) are listed  below for reference.    Significant Diagnostic Studies: Dg Chest 2 View  03/21/2012  *RADIOLOGY REPORT*  Clinical Data: cough  CHEST - 2 VIEW  Comparison: 03/19/2012  Findings: Normal heart size.  No pleural effusion or edema.  There is no airspace consolidation identified.  Spondylosis noted within the thoracic spine.  IMPRESSION:  1.  No acute cardiopulmonary abnormalities.   Original Report Authenticated By: Signa Kell, M.D.    Dg Chest 2 View  03/19/2012  *RADIOLOGY REPORT*  Clinical Data: Fever and weakness for 2 days.  CHEST - 2 VIEW  Comparison: None.  Findings: The lungs are clear and fully expanded.  No infiltrates or masses.  No effusions or pneumothoraces.  The aorta is tortuous. The heart and mediastinal structures are normal.  There are no acute bony changes.  IMPRESSION: No active disease.   Original Report Authenticated By: Sander Radon, M.D.    Ct Head Wo Contrast  03/20/2012  *RADIOLOGY REPORT*  Clinical Data: Body aches and weakness; confusion.  Slurred speech.  CT HEAD WITHOUT CONTRAST  Technique:  Contiguous axial images were obtained from the base of the skull through the vertex without contrast.  Comparison:  None.  Findings: There is no evidence of acute infarction, mass lesion, or intra- or extra-axial hemorrhage on CT.  The posterior fossa, including the cerebellum, brainstem and fourth ventricle, is within normal limits.  The third and lateral ventricles, and basal ganglia are unremarkable in appearance.  The cerebral hemispheres are symmetric in appearance, with normal gray- white differentiation.  No mass effect or midline shift is seen.  There is no evidence of fracture; visualized osseous structures are unremarkable in appearance.  The orbits are within normal limits. A mucus retention cyst or polyp is noted at the left maxillary sinus, and mild mucosal thickening is noted at the right maxillary sinus.  There is partial opacification of the ethmoid air cells. The The remaining paranasal sinuses and mastoid air cells are well- aerated.  No significant soft tissue abnormalities are seen.  IMPRESSION:  1.  No acute intracranial pathology seen on CT. 2.  Mucus retention cyst or polyp at the left maxillary sinus, and mild mucosal thickening at the right maxillary sinus.   Original Report Authenticated By: Tonia Ghent, M.D.    US Abdomen Complete  03/22/2012  *RADIOLOGY REPORT*  Clinical Data:  Cirrhosis.  Urinary tract infection.  COMPLETE ABDOMINAL ULTRASOUND  Comparison:  CT scan 03/21/2012.  Findings:  Gallbladder:  No gallstones, gallbladder wall thickening, or pericholecystic fluid. Layering echogenic sludge is noted. Negative sonographic Murphy's sign.  Common bile duct:  Mild common bile duct dilatation with maximal measurement of 9.3 mm.  No definite common bile duct stone is identified.  Liver:  Nodular liver contour with coarse heterogeneous echogenicity consistent with cirrhosis. Area of decreased echogenicity in the left lobe (measuring approximately 2 x 1 x 2.3 cm) could be a regenerating nodule.  Do not see any lesion on the CT scan in this area.  MRI surveillance is recommended given the cirrhosis.   IVC:  Normal caliber.  Pancreas:  Sonographically normal.  Spleen:  Splenomegaly again demonstrated but no focal lesions.  Right Kidney:  11.3 cm in length. Normal renal cortical thickness and echogenicity without focal lesions or hydronephrosis.  Left Kidney:  12.2 cm in length. Normal renal cortical thickness and echogenicity without focal lesions or hydronephrosis.  Abdominal aorta:  Normal caliber.  IMPRESSION:  1.  Cirrhotic changes involving the liver as discussed above.  2.  Layering echogenic sludge in the gallbladder. 3.  Splenomegaly   Original Report Authenticated By: Rudie Meyer, M.D.    Ct Abdomen Pelvis W Contrast  03/21/2012  *RADIOLOGY REPORT*  Clinical Data: Fever, abdominal pain.  CT ABDOMEN AND PELVIS WITH CONTRAST  Technique:  Multidetector CT imaging of the abdomen and pelvis was performed following the standard protocol during bolus administration of intravenous contrast.  Contrast: OMNIPAQUE IOHEXOL 300 MG/ML  SOLN  Comparison: None.  Findings: Visualized lung bases clear.  Liver has a nodular contour, without focal lesion or intrahepatic biliary ductal dilatation.  Gallbladder is nondistended.  Splenomegaly, 16.2 cm craniocaudal length.  Unremarkable adrenal glands, pancreas, kidneys.  Patchy aortoiliac plaque without aneurysm.  Stomach, small bowel, and colon are nondilated.  Normal appendix.  No ascites.  Urinary bladder incompletely distended.  Bilateral pelvic phleboliths.  No of free air.  Mild spondylitic changes in the lower lumbar spine.  IMPRESSION:  1.  Nodular liver contour suggesting cirrhosis, with splenomegaly but no other stigmata of portal venous hypertension. 2.  No acute abdominal process.   Original Report Authenticated By: D. Andria Rhein, MD     Microbiology: Recent Results (from the past 240 hour(s))  URINE CULTURE     Status: None   Collection Time    03/21/12  1:00 PM      Result Value Range Status   Specimen Description URINE, CLEAN CATCH   Final    Special Requests NONE   Final   Culture  Setup Time 03/21/2012 15:28   Final   Colony Count >=100,000 COLONIES/ML   Final   Culture     Final   Value: STAPHYLOCOCCUS AUREUS     Note: RIFAMPIN AND GENTAMICIN SHOULD NOT BE USED AS SINGLE DRUGS FOR TREATMENT OF STAPH INFECTIONS.   Report Status 03/23/2012 FINAL   Final   Organism ID, Bacteria STAPHYLOCOCCUS AUREUS   Final  CULTURE, BLOOD (ROUTINE X 2)     Status: None   Collection Time    03/21/12  6:50 PM      Result Value Range Status   Specimen Description BLOOD ARM RIGHT   Final   Special Requests BOTTLES DRAWN AEROBIC AND ANAEROBIC 10CC   Final   Culture  Setup Time 03/22/2012 00:32   Final   Culture     Final   Value:        BLOOD CULTURE RECEIVED NO GROWTH TO DATE CULTURE WILL BE HELD FOR 5 DAYS BEFORE ISSUING A FINAL NEGATIVE REPORT   Report Status PENDING   Incomplete  CULTURE, BLOOD (ROUTINE X 2)     Status: None   Collection Time    03/21/12  7:05 PM      Result Value Range Status   Specimen Description BLOOD HAND RIGHT   Final   Special Requests BOTTLES DRAWN AEROBIC ONLY 10CC   Final   Culture  Setup Time 03/22/2012 00:33   Final   Culture     Final   Value:        BLOOD CULTURE RECEIVED NO GROWTH TO DATE CULTURE WILL BE HELD FOR 5 DAYS BEFORE ISSUING A FINAL NEGATIVE REPORT   Report Status PENDING   Incomplete     Labs: Basic Metabolic Panel:  Recent Labs Lab 03/19/12 2345 03/21/12 1246 03/21/12 1929 03/22/12 0530 03/24/12 0804  NA 137 136  --  139 139  K 3.6 3.3*  --  3.3* 4.2  CL 104 102  --  107 106  CO2 24 24  --  24 25  GLUCOSE 110* 204*  --  113* 85  BUN 9 11  --  11 9  CREATININE 0.78 0.95  --  0.85 0.84  CALCIUM 8.4 8.4  --  7.9* 8.4  MG  --   --  1.7  --   --    Liver Function Tests:  Recent Labs Lab 03/21/12 1246 03/22/12 0530  AST 49* 37  ALT 25 19  ALKPHOS 128* 107  BILITOT 1.1 0.9  PROT 7.6 6.3  ALBUMIN 2.6* 2.2*   No results found for this basename: LIPASE, AMYLASE,  in the last  168 hours No results found for this basename: AMMONIA,  in the last 168 hours CBC:  Recent Labs Lab 03/19/12 2345 03/21/12 1246 03/22/12 0530 03/24/12 0804  WBC 3.7* 5.0 4.6 3.6*  NEUTROABS 1.9 2.6  --   --   HGB 9.5* 9.9* 8.6* 9.0*  HCT 28.6* 29.2* 26.2* 26.8*  MCV 84.9 83.9 83.7 83.5  PLT 68* 67* 60* 71*   Cardiac Enzymes:  Recent Labs Lab 03/19/12 2345 03/21/12 1849  CKTOTAL 168 125   BNP: BNP (last 3 results) No results found for this basename: PROBNP,  in the last 8760 hours CBG:  Recent Labs Lab 03/23/12 0823 03/23/12 1200 03/23/12 1653 03/23/12 2124 03/24/12 0745  GLUCAP 89 144* 84 115* 75       Signed:  Mitsuye Schrodt A  [facility] 03/24/2012, 9:51 AM

## 2012-03-24 NOTE — Progress Notes (Signed)
Patient discharge teaching given, including activity, diet, follow-up appoints, and medications. Patient verbalized understanding of all discharge instructions. IV access was d/c'd. Vitals are stable. Skin is intact except as charted in most recent assessments. Pt to be escorted out by NT, to be driven home by family. 

## 2012-03-28 LAB — CULTURE, BLOOD (ROUTINE X 2): Culture: NO GROWTH

## 2012-04-01 ENCOUNTER — Telehealth: Payer: Self-pay | Admitting: Internal Medicine

## 2012-04-01 NOTE — Telephone Encounter (Signed)
Referring: ED Referral

## 2012-04-04 ENCOUNTER — Other Ambulatory Visit: Payer: BC Managed Care – PPO | Admitting: Lab

## 2012-04-04 ENCOUNTER — Ambulatory Visit: Payer: BC Managed Care – PPO

## 2012-04-04 ENCOUNTER — Ambulatory Visit: Payer: BC Managed Care – PPO | Admitting: Internal Medicine

## 2012-04-04 ENCOUNTER — Encounter: Payer: Self-pay | Admitting: Gastroenterology

## 2012-04-04 NOTE — Progress Notes (Signed)
The patient is no show for the second time. If needed he would be scheduled with a different physician.

## 2012-04-06 ENCOUNTER — Telehealth: Payer: Self-pay | Admitting: Oncology

## 2012-04-06 ENCOUNTER — Other Ambulatory Visit: Payer: Self-pay | Admitting: Oncology

## 2012-04-06 NOTE — Telephone Encounter (Signed)
C/D 04/06/12 for appt. 04/11/12

## 2012-04-06 NOTE — Telephone Encounter (Signed)
R/S PT TO DR HA FOR 04/11/12@3 :00

## 2012-04-11 ENCOUNTER — Other Ambulatory Visit (HOSPITAL_BASED_OUTPATIENT_CLINIC_OR_DEPARTMENT_OTHER): Payer: BC Managed Care – PPO | Admitting: Lab

## 2012-04-11 ENCOUNTER — Encounter: Payer: Self-pay | Admitting: Oncology

## 2012-04-11 ENCOUNTER — Ambulatory Visit: Payer: BC Managed Care – PPO

## 2012-04-11 ENCOUNTER — Ambulatory Visit (HOSPITAL_BASED_OUTPATIENT_CLINIC_OR_DEPARTMENT_OTHER): Payer: BC Managed Care – PPO | Admitting: Oncology

## 2012-04-11 ENCOUNTER — Telehealth: Payer: Self-pay | Admitting: Oncology

## 2012-04-11 VITALS — BP 149/87 | HR 88 | Temp 97.2°F | Resp 18 | Ht 65.9 in | Wt 189.3 lb

## 2012-04-11 DIAGNOSIS — D539 Nutritional anemia, unspecified: Secondary | ICD-10-CM

## 2012-04-11 DIAGNOSIS — K7460 Unspecified cirrhosis of liver: Secondary | ICD-10-CM

## 2012-04-11 DIAGNOSIS — D696 Thrombocytopenia, unspecified: Secondary | ICD-10-CM

## 2012-04-11 DIAGNOSIS — R161 Splenomegaly, not elsewhere classified: Secondary | ICD-10-CM

## 2012-04-11 DIAGNOSIS — D509 Iron deficiency anemia, unspecified: Secondary | ICD-10-CM

## 2012-04-11 LAB — LACTATE DEHYDROGENASE (CC13): LDH: 186 U/L (ref 125–245)

## 2012-04-11 LAB — COMPREHENSIVE METABOLIC PANEL (CC13)
Albumin: 2.8 g/dL — ABNORMAL LOW (ref 3.5–5.0)
CO2: 25 mEq/L (ref 22–29)
Glucose: 117 mg/dl — ABNORMAL HIGH (ref 70–99)
Potassium: 3.3 mEq/L — ABNORMAL LOW (ref 3.5–5.1)
Sodium: 141 mEq/L (ref 136–145)
Total Bilirubin: 1.24 mg/dL — ABNORMAL HIGH (ref 0.20–1.20)
Total Protein: 7.9 g/dL (ref 6.4–8.3)

## 2012-04-11 LAB — CBC & DIFF AND RETIC
BASO%: 0.3 % (ref 0.0–2.0)
Immature Retic Fract: 6.7 % (ref 3.00–10.60)
MCHC: 33 g/dL (ref 32.0–36.0)
MONO#: 0.6 10*3/uL (ref 0.1–0.9)
RBC: 3.71 10*6/uL — ABNORMAL LOW (ref 4.20–5.82)
Retic %: 1.61 % (ref 0.80–1.80)
WBC: 3.7 10*3/uL — ABNORMAL LOW (ref 4.0–10.3)
lymph#: 1.5 10*3/uL (ref 0.9–3.3)

## 2012-04-11 NOTE — Progress Notes (Signed)
Checked in new pt with no financial concerns. °

## 2012-04-11 NOTE — Progress Notes (Signed)
[facility]  Telephone:([phone] Fax:([phone]) [phone]     INITIAL HEMATOLOGY CONSULTATION    Referral MD:  Dr. Callie Fielding, M.D.  Reason for Referral: anemia and thrombocytopenia.     HPI: [name] is a 62 year-old [place] American with no significant past medical history.  He was in [facility] until about 2 years prior to presentation.  He developed fatigue, cold/sinus symptom.  He was given multiple empiric antibiotics without improvement of his symptoms. About a month ago, he developed moderate to severe generalized fatigue.  He could hardly walk straight.  He was also confused.  He went to ED and was found to have anemia, thrombocytopenia, and cirrhosis on CT abdomen.  He was placed on oral iron with follow up with GI and Hematology.    Jonathon Cannon presented to the clinic for the first time today with a daughter.  He reported since starting oral iron, his strength has slightly improved. He can support his weight much better now compared to before.  His daughter said that he is no longer confused. He is independent of all activities of daily living. His appetite is normal; however, he has lost about 20-lb over the last year.  He does have mild bilateral pedal edema.  He denied jaundice, abdominal swelling, visible source of bleeding except for mild intermittent epistaxis.  He does have dark skin rash in the elbows and knees which was said to be due to eczema.    Patient denies fever, anorexia, headache, visual changes, confusion, drenching night sweats, palpable lymph node swelling, mucositis, odynophagia, dysphagia, nausea vomiting, chest pain, palpitation, shortness of breath, dyspnea on exertion, productive cough, gum bleeding, hematemesis, hemoptysis, abdominal pain, abdominal swelling, early satiety, melena, hematochezia, hematuria, joint swelling, joint pain, heat or cold intolerance, bowel bladder incontinence, back pain, focal motor weakness, paresthesia,  depression.    Past Medical History  Diagnosis Date  . Hypertension   . Hypothyroid   . Pancytopenia   . Cirrhosis   . Diabetes mellitus, type II   :    No past surgical history on file.:   CURRENT MEDS: Current Outpatient Prescriptions  Medication Sig Dispense Refill  . Cholecalciferol (VITAMIN D3) 5000 UNITS CAPS Take 1 capsule by mouth daily.      . Ferrous Sulfate (PX IRON) 27 MG TABS Take 1 tablet by mouth daily.      . furosemide (LASIX) 20 MG tablet Take 20 mg by mouth daily.      Marland Kitchen levothyroxine (SYNTHROID, LEVOTHROID) 150 MCG tablet Take 1 tablet (150 mcg total) by mouth daily.  30 tablet  0  . metFORMIN (GLUCOPHAGE) 500 MG tablet Take 500 mg by mouth 2 (two) times daily with a meal.      . Multiple Minerals-Vitamins (CALCIUM & VIT D3 BONE HEALTH PO) Take 1 tablet by mouth daily.      . Multiple Vitamins-Minerals (MULTIVITAMIN WITH MINERALS) tablet Take 1 tablet by mouth daily.      . vitamin B-12 (CYANOCOBALAMIN) 1000 MCG tablet Take 1,000 mcg by mouth daily.       No current facility-administered medications for this visit.      No Known Allergies:  Family History  Problem Relation Age of Onset  . Asthma Father   :  History   Social History  . Marital Status: Married    Spouse Name: N/A    Number of Children: 4  . Years of Education: N/A   Occupational History  .  [employer].    Social History Main Topics  . Smoking status: Never Smoker   . Smokeless tobacco: Not on file  . Alcohol Use: Yes     Comment: Occasional  . Drug Use: No  . Sexually Active: Not on file   Other Topics Concern  . Not on file   Social History Narrative  . No narrative on file  :  REVIEW OF SYSTEM:  The rest of the 14-point review of sytem was negative.   Exam: ECOG 1.   General:  well-nourished man, in no acute distress.  Eyes:  no scleral icterus.  ENT:  There were no oropharyngeal lesions.  Neck was without thyromegaly.  Lymphatics:  Negative  cervical, supraclavicular or axillary adenopathy.  Respiratory: lungs were clear bilaterally without wheezing or crackles.  Cardiovascular:  Regular rate and rhythm, S1/S2, without murmur, rub or gallop.  There was no pedal edema.  GI:  abdomen was soft, flat, nontender, nondistended, without organomegaly. Stool was brown; hem occult positive.   Muscoloskeletal:  no spinal tenderness of palpation of vertebral spine.  Skin exam was without echymosis, petichae.  Neuro exam was nonfocal. There was no asterixis.    Patient was able to get on and off exam table without assistance.  Gait was normal.  Patient was alerted and oriented.  Attention was good.   Language was appropriate.  Mood was normal without depression.  Speech was not pressured.  Thought content was not tangential.    LABS:  Lab Results  Component Value Date   WBC 3.7* 04/11/2012   HGB 10.6* 04/11/2012   HCT 32.1* 04/11/2012   PLT 60 Large platelets present* 04/11/2012   GLUCOSE 117* 04/11/2012   ALT 28 04/11/2012   AST 48* 04/11/2012   NA 141 04/11/2012   K 3.3* 04/11/2012   CL 108* 04/11/2012   CREATININE 0.8 04/11/2012   BUN 10.4 04/11/2012   CO2 25 04/11/2012   PSA 0.37 03/21/2012   INR 1.54* 03/21/2012   HGBA1C 6.3* 03/21/2012      US Abdomen Complete  03/22/2012  *RADIOLOGY REPORT*  Clinical Data:  Cirrhosis.  Urinary tract infection.  COMPLETE ABDOMINAL ULTRASOUND  Comparison:  CT scan 03/21/2012.  Findings:  Gallbladder:  No gallstones, gallbladder wall thickening, or pericholecystic fluid. Layering echogenic sludge is noted. Negative sonographic Murphy's sign.  Common bile duct:  Mild common bile duct dilatation with maximal measurement of 9.3 mm.  No definite common bile duct stone is identified.  Liver:  Nodular liver contour with coarse heterogeneous echogenicity consistent with cirrhosis. Area of decreased echogenicity in the left lobe (measuring approximately 2 x 1 x 2.3 cm) could be a regenerating nodule.  Do not see any lesion on  the CT scan in this area.  MRI surveillance is recommended given the cirrhosis.  IVC:  Normal caliber.  Pancreas:  Sonographically normal.  Spleen:  Splenomegaly again demonstrated but no focal lesions.  Right Kidney:  11.3 cm in length. Normal renal cortical thickness and echogenicity without focal lesions or hydronephrosis.  Left Kidney:  12.2 cm in length. Normal renal cortical thickness and echogenicity without focal lesions or hydronephrosis.  Abdominal aorta:  Normal caliber.  IMPRESSION:  1.  Cirrhotic changes involving the liver as discussed above. 2.  Layering echogenic sludge in the gallbladder. 3.  Splenomegaly   Original Report Authenticated By: Rudie Meyer, M.D.    Ct Abdomen Pelvis W Contrast  03/21/2012  *RADIOLOGY REPORT*  Clinical Data: Fever, abdominal pain.  CT ABDOMEN AND PELVIS WITH CONTRAST  Technique:  Multidetector CT imaging of the abdomen and pelvis was performed following the standard protocol during bolus administration of intravenous contrast.  Contrast: OMNIPAQUE IOHEXOL 300 MG/ML  SOLN  Comparison: None.  Findings: Visualized lung bases clear.  Liver has a nodular contour, without focal lesion or intrahepatic biliary ductal dilatation.  Gallbladder is nondistended.  Splenomegaly, 16.2 cm craniocaudal length.  Unremarkable adrenal glands, pancreas, kidneys.  Patchy aortoiliac plaque without aneurysm.  Stomach, small bowel, and colon are nondilated.  Normal appendix.  No ascites.  Urinary bladder incompletely distended.  Bilateral pelvic phleboliths.  No of free air.  Mild spondylitic changes in the lower lumbar spine.  IMPRESSION:  1.  Nodular liver contour suggesting cirrhosis, with splenomegaly but no other stigmata of portal venous hypertension. 2.  No acute abdominal process.   Original Report Authenticated By: D. Andria Rhein, MD     Blood smear review:   I personally reviewed the patient's peripheral blood smear today.  There was isocytosis.  There was no peripheral  blast.  There was no schistocytosis, spherocytosis, target cell, rouleaux formation, tear drop cell.  There was no giant platelets or platelet clumps.      ASSESSMENT AND PLAN:   1.  Cirrhosis:  Unclear etiology.  He does not drink excessive EtOH.  His hepatitis serology in 03/2012 were negative.  There was no family history of cirrhosis.  He has an appointment with Dr. Arlyce Dice later this month for evaluation.  2.  Normocytic anemia:  Most likely due to chronic occult GI bleeding.  Potential sources include esophageal/gastric varices given his cirrhosis.  He is iron deficiency.  He is on oral iron with improvement of his Hgb.  There is no indication for pRBC transfusion today.  3.  Thrombocytopenia:  Most likely due to cirrhosis.  There was sign of portal hypertension with splenomegaly on CT scan. - Referral to a Hepatologist (liver doctor) for evaluation. -If diagnosis is cirrhosis-induced low blood count, treatment entails treating cirrhosis and transfusion as needed for low blood count.  There is low pretest probability of bone marrow failure causing cytopenia.  I personally think that a bone marrow biopsy has low (but not absolutely zero) clinical utility.   Patient's daughter would like patient to see GI/Liver first before ruling in or out diagnostic bone marrow biopsy.  4.  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    5.  Follow up:  In about 1 month.     The length of time of the face-to-face encounter was 45 minutes. More than 50% of time was spent counseling and coordination of care.     Thank you for this referral.

## 2012-04-11 NOTE — Patient Instructions (Addendum)
1.  Issue:  Anemia and low platelet.  2.  Potential causes:  Cirrhosis of the liver as shown on CT scan and confusion.  I have low concern for primary bone marrow failure causing these issues.  However, a bone marrow biopsy may be considered to rule this out. 3.  Recommendation:  *  Referral to a Hepatologist (liver doctor) for evaluation.  *  If diagnosis is cirrhosis-induced low blood count, treatment entails treating cirrhosis and transfusion as needed for low blood count.

## 2012-04-11 NOTE — Telephone Encounter (Signed)
gave pt appt for lab and MD on April 2014

## 2012-04-15 LAB — SPEP & IFE WITH QIG
Alpha-1-Globulin: 3 % (ref 2.9–4.9)
Alpha-2-Globulin: 5.9 % — ABNORMAL LOW (ref 7.1–11.8)
Gamma Globulin: 30.8 % — ABNORMAL HIGH (ref 11.1–18.8)

## 2012-04-15 LAB — VITAMIN B12: Vitamin B-12: 501 pg/mL (ref 211–911)

## 2012-04-15 LAB — IRON AND TIBC
%SAT: 25 % (ref 20–55)
TIBC: 331 ug/dL (ref 215–435)

## 2012-04-15 LAB — FERRITIN: Ferritin: 259 ng/mL (ref 22–322)

## 2012-04-15 LAB — ERYTHROPOIETIN: Erythropoietin: 25.1 m[IU]/mL — ABNORMAL HIGH (ref 2.6–18.5)

## 2012-04-22 ENCOUNTER — Encounter: Payer: Self-pay | Admitting: Gastroenterology

## 2012-04-22 ENCOUNTER — Ambulatory Visit (INDEPENDENT_AMBULATORY_CARE_PROVIDER_SITE_OTHER): Payer: BC Managed Care – PPO | Admitting: Gastroenterology

## 2012-04-22 ENCOUNTER — Other Ambulatory Visit (INDEPENDENT_AMBULATORY_CARE_PROVIDER_SITE_OTHER): Payer: BC Managed Care – PPO

## 2012-04-22 VITALS — BP 124/76 | HR 78 | Ht 66.5 in | Wt 188.0 lb

## 2012-04-22 DIAGNOSIS — K7460 Unspecified cirrhosis of liver: Secondary | ICD-10-CM

## 2012-04-22 DIAGNOSIS — Z23 Encounter for immunization: Secondary | ICD-10-CM

## 2012-04-22 DIAGNOSIS — Z1211 Encounter for screening for malignant neoplasm of colon: Secondary | ICD-10-CM

## 2012-04-22 LAB — FERRITIN: Ferritin: 115.9 ng/mL (ref 22.0–322.0)

## 2012-04-22 NOTE — Assessment & Plan Note (Signed)
Etiology of cirrhosis is unclear. Patient has stable hepatic function.  Recommendations #1 check iron studies ANA, AMA, alpha 1 antitrysin level, cerruloplasmin level, alpha-fetoprotein #2 vaccination against hepatitis A and B. #3 upper endoscopy to rule out varices

## 2012-04-22 NOTE — Patient Instructions (Addendum)
You will go to the basement today for labs  You have been scheduled for an endoscopy with propofol. Please follow written instructions given to you at your visit today. If you use inhalers (even only as needed), please bring them with you on the day of your procedure.  You have been scheduled for a colonoscopy with propofol. Please follow written instructions given to you at your visit today.  Please pick up your prep kit at the pharmacy within the next 1-3 days. If you use inhalers (even only as needed), please bring them with you on the day of your procedure.  We are giving you a Hep A/B Vaccine today You will come back in 7 days for the next You will come back in 21-30 days from the first vaccine You will come back in 12 months for the next injection

## 2012-04-22 NOTE — Progress Notes (Signed)
History of Present Illness: Pleasant 62 year old [place] male referred for evaluation of cirrhosis. He was admitted last month for UTI and weakness. CT scan, which I reviewed, demonstrated a nodular liver and splenomegaly consistent with cirrhosis. Serologies for hepatitis AB and C. were negative. The patient has no history of liver disease, jaundice or hepatitis. He does not drink regularly. He has no GI complaints.    Past Medical History  Diagnosis Date  . Hypertension   . Hypothyroid   . Pancytopenia   . Cirrhosis   . Diabetes mellitus, type II    History reviewed. No pertinent past surgical history. family history includes Asthma in his father. Current Outpatient Prescriptions  Medication Sig Dispense Refill  . Cholecalciferol (VITAMIN D3) 5000 UNITS CAPS Take 1 capsule by mouth daily.      . Ferrous Sulfate (PX IRON) 27 MG TABS Take 1 tablet by mouth daily.      . furosemide (LASIX) 20 MG tablet Take 20 mg by mouth daily.      Marland Kitchen levothyroxine (SYNTHROID, LEVOTHROID) 150 MCG tablet Take 1 tablet (150 mcg total) by mouth daily.  30 tablet  0  . metFORMIN (GLUCOPHAGE) 500 MG tablet Take 500 mg by mouth 2 (two) times daily with a meal.      . Multiple Minerals-Vitamins (CALCIUM & VIT D3 BONE HEALTH PO) Take 1 tablet by mouth daily.      . Multiple Vitamins-Minerals (MULTIVITAMIN WITH MINERALS) tablet Take 1 tablet by mouth daily.      . vitamin B-12 (CYANOCOBALAMIN) 1000 MCG tablet Take 1,000 mcg by mouth daily.       No current facility-administered medications for this visit.   Allergies as of 04/22/2012  . (No Known Allergies)    reports that he has never smoked. He has never used smokeless tobacco. He reports that  drinks alcohol. He reports that he does not use illicit drugs.     Review of Systems: He complains of bilateral thigh pain Pertinent positive and negative review of systems were noted in the above HPI section. All other review of systems were otherwise  negative.  Vital signs were reviewed in today's medical record Physical Exam: General: Well developed , well nourished, no acute distress Skin: anicteric Head: Normocephalic and atraumatic Eyes:  sclerae anicteric, EOMI Ears: Normal auditory acuity Mouth: No deformity or lesions Neck: Supple, no masses or thyromegaly Lungs: Clear throughout to auscultation Heart: Regular rate and rhythm; no murmurs, rubs or bruits Abdomen: Soft, non tender and non distended. No masses, hepatosplenomegaly or hernias noted. Normal Bowel sounds Rectal:deferred Musculoskeletal: Symmetrical with no gross deformities  Skin: No lesions on visible extremities Pulses:  Normal pulses noted Extremities: No clubbing, cyanosis, edema or deformities noted Neurological: Alert oriented x 4, grossly nonfocal Cervical Nodes:  No significant cervical adenopathy Inguinal Nodes: No significant inguinal adenopathy Psychological:  Alert and cooperative. Normal mood and affect

## 2012-04-22 NOTE — Assessment & Plan Note (Signed)
Plan screening colonoscopy 

## 2012-04-23 LAB — ALPHA-1-ANTITRYPSIN: A-1 Antitrypsin, Ser: 152 mg/dL (ref 90–200)

## 2012-04-25 LAB — ANA: Anti Nuclear Antibody(ANA): NEGATIVE

## 2012-04-25 LAB — MITOCHONDRIAL ANTIBODIES: Mitochondrial M2 Ab, IgG: 0.55 (ref <0.91)

## 2012-04-28 ENCOUNTER — Other Ambulatory Visit: Payer: Self-pay | Admitting: Gastroenterology

## 2012-04-28 ENCOUNTER — Encounter: Payer: Self-pay | Admitting: Gastroenterology

## 2012-04-28 ENCOUNTER — Ambulatory Visit (AMBULATORY_SURGERY_CENTER): Payer: BC Managed Care – PPO | Admitting: Gastroenterology

## 2012-04-28 VITALS — BP 125/82 | HR 85 | Temp 97.1°F | Resp 18 | Ht 66.0 in | Wt 188.0 lb

## 2012-04-28 DIAGNOSIS — Z1211 Encounter for screening for malignant neoplasm of colon: Secondary | ICD-10-CM

## 2012-04-28 DIAGNOSIS — K709 Alcoholic liver disease, unspecified: Secondary | ICD-10-CM

## 2012-04-28 LAB — GLUCOSE, CAPILLARY
Glucose-Capillary: 112 mg/dL — ABNORMAL HIGH (ref 70–99)
Glucose-Capillary: 85 mg/dL (ref 70–99)

## 2012-04-28 MED ORDER — SODIUM CHLORIDE 0.9 % IV SOLN: 500.0000 mL | INTRAVENOUS | Status: DC

## 2012-04-28 NOTE — Progress Notes (Signed)
DISCUSSED WITH DAUGHTER THE NEED TO CALL PCP IF COUGH CONTINUES. DAUGHTER STATING SHE WAS GOING TO CALL MD YESTERDAY AND WILL MAKE AN APPOINTMENT. PATIENT'S COUGHING CEASED AT PRESENT.

## 2012-04-28 NOTE — Progress Notes (Signed)
PATIENT WITH CROUPY COUGH EXPECTORATING CLEAR SPUTUM.  INFORMED MARK SMITH CRNA, WHO CHECKED PATIENT. PATIENT WITH CROUPY COUGH PREPROCEDURE. PATIENT'S DAUGHTER ALSO STATING PATIENT WITH COUGH PREPROCEDURE.

## 2012-04-28 NOTE — Progress Notes (Signed)
Patient did not experience any of the following events: a burn prior to discharge; a fall within the facility; wrong site/side/patient/procedure/implant event; or a hospital transfer or hospital admission upon discharge from the facility. (G8907) Patient did not have preoperative order for IV antibiotic SSI prophylaxis. (G8918)  

## 2012-04-28 NOTE — Progress Notes (Signed)
Lidocaine-40mg IV prior to Propofol InductionPropofol given over incremental dosages 

## 2012-04-28 NOTE — Op Note (Signed)
[facility] [address] [place], [address]   ENDOSCOPY PROCEDURE REPORT  PATIENT: Cannon, Jonathon Bay  MR#: [id number] BIRTHDATE: [birth year] , 61  yrs. old GENDER: Male ENDOSCOPIST: Louis Meckel, MD REFERRED BY: PROCEDURE DATE:  04/28/2012 PROCEDURE:  EGD, diagnostic ASA CLASS:     Class III INDICATIONS:  Screening for varices. MEDICATIONS: MAC sedation, administered by CRNA, propofol (Diprivan) 100mg  IV, and Simethicone 0.6cc PO TOPICAL ANESTHETIC:  DESCRIPTION OF PROCEDURE: After the risks benefits and alternatives of the procedure were thoroughly explained, informed consent was obtained.  The [facility] [id number] endoscope was introduced through the mouth and advanced to the third portion of the duodenum. Without limitations.  The instrument was slowly withdrawn as the mucosa was fully examined.      The upper, middle and distal third of the esophagus were carefully inspected and no abnormalities were noted.  The z-line was well seen at the GEJ.  The endoscope was pushed into the fundus which was normal including a retroflexed view.  The antrum, gastric body, first and second part of the duodenum were unremarkable. Retroflexed views revealed no abnormalities.     The scope was then withdrawn from the patient and the procedure completed.  COMPLICATIONS: There were no complications. ENDOSCOPIC IMPRESSION: Normal EGD  RECOMMENDATIONS: Continue current meds  REPEAT EXAM: 1 year  eSigned:  Louis Meckel, MD 04/28/2012 2:24 PM   CC: Callie Fielding, MD

## 2012-04-28 NOTE — Progress Notes (Signed)
BP ELEVATED 162/104, CHANGED CUFF POSITION X 2 AND PATIENT SITTING UPRIGHT. INFORMED Jonathon SMITH CRNA WHO CHECKED PATIENT AND CLEARED FOR DISCHARGE.RELATED TO HTN HISTORY AND BASELINE BP.

## 2012-04-28 NOTE — Patient Instructions (Signed)
YOU HAD AN ENDOSCOPIC PROCEDURE TODAY AT THE [facility]: Refer to the procedure report that was given to you for any specific questions about what was found during the examination.  If the procedure report does not answer your questions, please call your gastroenterologist to clarify.  If you requested that your care partner not be given the details of your procedure findings, then the procedure report has been included in a sealed envelope for you to review at your convenience later.  YOU SHOULD EXPECT: Some feelings of bloating in the abdomen. Passage of more gas than usual.  Walking can help get rid of the air that was put into your GI tract during the procedure and reduce the bloating. If you had a lower endoscopy (such as a colonoscopy or flexible sigmoidoscopy) you may notice spotting of blood in your stool or on the toilet paper. If you underwent a bowel prep for your procedure, then you may not have a normal bowel movement for a few days.  DIET: Your first meal following the procedure should be a light meal and then it is ok to progress to your normal diet.  A half-sandwich or bowl of soup is an example of a good first meal.  Heavy or fried foods are harder to digest and may make you feel nauseous or bloated.  Likewise meals heavy in dairy and vegetables can cause extra gas to form and this can also increase the bloating.  Drink plenty of fluids but you should avoid alcoholic beverages for 24 hours.  ACTIVITY: Your care partner should take you home directly after the procedure.  You should plan to take it easy, moving slowly for the rest of the day.  You can resume normal activity the day after the procedure however you should NOT DRIVE or use heavy machinery for 24 hours (because of the sedation medicines used during the test).    SYMPTOMS TO REPORT IMMEDIATELY: A gastroenterologist can be reached at any hour.  During normal business hours, 8:30 AM to 5:00 PM Monday through Friday,  call [phone].  After hours and on weekends, please call the GI answering service at [phone] who will take a message and have the physician on call contact you.  Following upper endoscopy (EGD)  Vomiting of blood or coffee ground material  New chest pain or pain under the shoulder blades  Painful or persistently difficult swallowing  New shortness of breath  Fever of 100F or higher  Black, tarry-looking stools  FOLLOW UP: If any biopsies were taken you will be contacted by phone or by letter within the next 1-3 weeks.  Call your gastroenterologist if you have not heard about the biopsies in 3 weeks.  Our staff will call the home number listed on your records the next business day following your procedure to check on you and address any questions or concerns that you may have at that time regarding the information given to you following your procedure. This is a courtesy call and so if there is no answer at the home number and we have not heard from you through the emergency physician on call, we will assume that you have returned to your regular daily activities without incident.  SIGNATURES/CONFIDENTIALITY: You and/or your care partner have signed paperwork which will be entered into your electronic medical record.  These signatures attest to the fact that that the information above on your After Visit Summary has been reviewed and is understood.  Full responsibility of   the confidentiality of this discharge information lies with you and/or your care-partner. 

## 2012-04-29 ENCOUNTER — Telehealth: Payer: Self-pay | Admitting: *Deleted

## 2012-04-29 ENCOUNTER — Ambulatory Visit (INDEPENDENT_AMBULATORY_CARE_PROVIDER_SITE_OTHER): Payer: BC Managed Care – PPO | Admitting: Gastroenterology

## 2012-04-29 DIAGNOSIS — Z23 Encounter for immunization: Secondary | ICD-10-CM

## 2012-04-29 NOTE — Telephone Encounter (Signed)
  Follow up Call-  Call back number 04/28/2012  Post procedure Call Back phone  # [phone]  Permission to leave phone message Yes     Patient questions:  Do you have a fever, pain , or abdominal swelling? no Pain Score  0 *  Have you tolerated food without any problems? yes  Have you been able to return to your normal activities? yes  Do you have any questions about your discharge instructions: Diet   no Medications  no Follow up visit  no  Do you have questions or concerns about your Care? no  Actions: * If pain score is 4 or above: No action needed, pain <4.

## 2012-05-10 ENCOUNTER — Telehealth: Payer: Self-pay | Admitting: Oncology

## 2012-05-12 ENCOUNTER — Other Ambulatory Visit: Payer: BC Managed Care – PPO | Admitting: Lab

## 2012-05-12 ENCOUNTER — Ambulatory Visit: Payer: BC Managed Care – PPO | Admitting: Oncology

## 2012-05-16 ENCOUNTER — Ambulatory Visit (INDEPENDENT_AMBULATORY_CARE_PROVIDER_SITE_OTHER): Payer: BC Managed Care – PPO | Admitting: Gastroenterology

## 2012-05-16 DIAGNOSIS — Z23 Encounter for immunization: Secondary | ICD-10-CM

## 2012-06-09 ENCOUNTER — Encounter: Payer: Self-pay | Admitting: Gastroenterology

## 2012-06-09 ENCOUNTER — Other Ambulatory Visit: Payer: Self-pay | Admitting: Gastroenterology

## 2012-06-09 ENCOUNTER — Ambulatory Visit (AMBULATORY_SURGERY_CENTER): Payer: BC Managed Care – PPO | Admitting: Gastroenterology

## 2012-06-09 VITALS — BP 143/90 | HR 67 | Temp 96.7°F | Resp 19 | Ht 66.0 in | Wt 188.0 lb

## 2012-06-09 DIAGNOSIS — Z1211 Encounter for screening for malignant neoplasm of colon: Secondary | ICD-10-CM

## 2012-06-09 DIAGNOSIS — D126 Benign neoplasm of colon, unspecified: Secondary | ICD-10-CM

## 2012-06-09 LAB — GLUCOSE, CAPILLARY: Glucose-Capillary: 86 mg/dL (ref 70–99)

## 2012-06-09 MED ORDER — DEXTROSE 5 % IV SOLN: INTRAVENOUS | Status: DC

## 2012-06-09 NOTE — Op Note (Signed)
[facility] [address] [place], [address]   COLONOSCOPY PROCEDURE REPORT  PATIENT: Cannon, Jonathon Burnie  MR#: [id number] BIRTHDATE: 10-07-1950 , 61  yrs. old GENDER: Male ENDOSCOPIST: Louis Meckel, MD REFERRED BY: PROCEDURE DATE:  06/09/2012 PROCEDURE:   Colonoscopy with cold biopsy polypectomy ASA CLASS:   Class III INDICATIONS: MEDICATIONS: MAC sedation, administered by CRNA and propofol (Diprivan) 300mg  IV  DESCRIPTION OF PROCEDURE:   After the risks benefits and alternatives of the procedure were thoroughly explained, informed consent was obtained.  A digital rectal exam revealed no abnormalities of the rectum.   The [facility] [id number]  endoscope was introduced through the anus and advanced to the cecum, which was identified by both the appendix and ileocecal valve. No adverse events experienced.   The quality of the prep was excellent using Suprep  The instrument was then slowly withdrawn as the colon was fully examined.      COLON FINDINGS: A sessile polyp measuring 2 mm in size was found in the descending colon.  A polypectomy was performed with cold forceps.  The resection was complete and the polyp tissue was completely retrieved.   The colon mucosa was otherwise normal. Retroflexed views revealed no abnormalities. The time to cecum=4 minutes 18 seconds.  Withdrawal time=8 minutes 10 seconds.  The scope was withdrawn and the procedure completed. COMPLICATIONS: There were no complications.  ENDOSCOPIC IMPRESSION: 1.   Sessile polyp measuring 2 mm in size was found in the descending colon; polypectomy was performed with cold forceps 2.   The colon mucosa was otherwise normal  RECOMMENDATIONS: If the polyp(s) removed today are proven to be adenomatous (pre-cancerous) polyps, you will need a repeat colonoscopy in 5 years.  Otherwise you should continue to follow colorectal cancer screening guidelines for "routine risk" patients with  colonoscopy in 10 years.  You will receive a letter within 1-2 weeks with the results of your biopsy as well as final recommendations.  Please call my office if you have not received a letter after 3 weeks.   eSigned:  Louis Meckel, MD 06/09/2012 2:02 PM   cc: Callie Fielding, MD   PATIENT NAME:  Jonathon, Cannon MR#: [id number]

## 2012-06-09 NOTE — Progress Notes (Addendum)
Patient arrived in recovery coughing and coughing.  Sats 100%, but patient is coughing up small amts of blood.  Dr. Arlyce Dice notified, and no orders were received.  Patient did not have preoperative order for IV antibiotic SSI prophylaxis. [phone])  Patient did not experience any of the following events: a burn prior to discharge; a fall within the facility; wrong site/side/patient/procedure/implant event; or a hospital transfer or hospital admission upon discharge from the facility. [phone])

## 2012-06-09 NOTE — Patient Instructions (Addendum)
YOU HAD AN ENDOSCOPIC PROCEDURE TODAY AT THE [facility]: Refer to the procedure report that was given to you for any specific questions about what was found during the examination.  If the procedure report does not answer your questions, please call your gastroenterologist to clarify.  If you requested that your care partner not be given the details of your procedure findings, then the procedure report has been included in a sealed envelope for you to review at your convenience later.  YOU SHOULD EXPECT: Some feelings of bloating in the abdomen. Passage of more gas than usual.  Walking can help get rid of the air that was put into your GI tract during the procedure and reduce the bloating. If you had a lower endoscopy (such as a colonoscopy or flexible sigmoidoscopy) you may notice spotting of blood in your stool or on the toilet paper. If you underwent a bowel prep for your procedure, then you may not have a normal bowel movement for a few days.  DIET: Your first meal following the procedure should be a light meal and then it is ok to progress to your normal diet.  A half-sandwich or bowl of soup is an example of a good first meal.  Heavy or fried foods are harder to digest and may make you feel nauseous or bloated.  Likewise meals heavy in dairy and vegetables can cause extra gas to form and this can also increase the bloating.  Drink plenty of fluids but you should avoid alcoholic beverages for 24 hours.  ACTIVITY: Your care partner should take you home directly after the procedure.  You should plan to take it easy, moving slowly for the rest of the day.  You can resume normal activity the day after the procedure however you should NOT DRIVE or use heavy machinery for 24 hours (because of the sedation medicines used during the test).    SYMPTOMS TO REPORT IMMEDIATELY: A gastroenterologist can be reached at any hour.  During normal business hours, 8:30 AM to 5:00 PM Monday through Friday,  call [phone].  After hours and on weekends, please call the GI answering service at [phone] who will take a message and have the physician on call contact you.   Following lower endoscopy (colonoscopy or flexible sigmoidoscopy):  Excessive amounts of blood in the stool  Significant tenderness or worsening of abdominal pains  Swelling of the abdomen that is new, acute  Fever of 100F or higher    FOLLOW UP: If any biopsies were taken you will be contacted by phone or by letter within the next 1-3 weeks.  Call your gastroenterologist if you have not heard about the biopsies in 3 weeks.  Our staff will call the home number listed on your records the next business day following your procedure to check on you and address any questions or concerns that you may have at that time regarding the information given to you following your procedure. This is a courtesy call and so if there is no answer at the home number and we have not heard from you through the emergency physician on call, we will assume that you have returned to your regular daily activities without incident.  SIGNATURES/CONFIDENTIALITY: You and/or your care partner have signed paperwork which will be entered into your electronic medical record.  These signatures attest to the fact that that the information above on your After Visit Summary has been reviewed and is understood.  Full responsibility of the confidentiality   of this discharge information lies with you and/or your care-partner.     

## 2012-06-10 ENCOUNTER — Telehealth: Payer: Self-pay | Admitting: *Deleted

## 2012-06-10 NOTE — Telephone Encounter (Signed)
  Follow up Call-  Call back number 06/09/2012 04/28/2012  Post procedure Call Back phone  # [phone]  Permission to leave phone message Yes Yes     Patient questions:  Do you have a fever, pain , or abdominal swelling? no Pain Score  0 *  Have you tolerated food without any problems? yes  Have you been able to return to your normal activities? yes  Do you have any questions about your discharge instructions: Diet   no Medications  no Follow up visit  no  Do you have questions or concerns about your Care? no  Actions: * If pain score is 4 or above: No action needed, pain <4.

## 2012-06-15 ENCOUNTER — Encounter: Payer: Self-pay | Admitting: Gastroenterology

## 2013-03-03 ENCOUNTER — Encounter: Payer: Self-pay | Admitting: Gastroenterology

## 2013-05-10 ENCOUNTER — Telehealth: Payer: Self-pay | Admitting: Gastroenterology

## 2013-05-10 NOTE — Telephone Encounter (Signed)
Patient is due for 4th Twinrix.  They would like to come in for follow up and 4th Twinrix on 5/27

## 2013-05-11 ENCOUNTER — Telehealth: Payer: Self-pay | Admitting: Hematology and Oncology

## 2013-05-11 NOTE — Telephone Encounter (Signed)
returend call to pt's dtr ms rani at [phone] (# given) re scheduling a follow up appt. not able to reach dtr. lm fior her to call office to schedule appt.

## 2013-05-11 NOTE — Telephone Encounter (Signed)
DTR LMONVM ONCE MORE RETURNING MY CALL. CALLED DTR MS. RANI [phone]) AND GV HER NEW APPT FOR 5/4 @ 1:30PM W/NG. FORMER HH PT.

## 2013-06-05 ENCOUNTER — Ambulatory Visit (HOSPITAL_BASED_OUTPATIENT_CLINIC_OR_DEPARTMENT_OTHER): Payer: No Typology Code available for payment source

## 2013-06-05 ENCOUNTER — Ambulatory Visit (HOSPITAL_BASED_OUTPATIENT_CLINIC_OR_DEPARTMENT_OTHER): Payer: No Typology Code available for payment source | Admitting: Hematology and Oncology

## 2013-06-05 ENCOUNTER — Encounter: Payer: Self-pay | Admitting: Hematology and Oncology

## 2013-06-05 ENCOUNTER — Telehealth: Payer: Self-pay | Admitting: Hematology and Oncology

## 2013-06-05 VITALS — BP 169/95 | HR 104 | Temp 98.1°F | Resp 20 | Ht 66.0 in | Wt 191.2 lb

## 2013-06-05 DIAGNOSIS — D509 Iron deficiency anemia, unspecified: Secondary | ICD-10-CM

## 2013-06-05 DIAGNOSIS — K769 Liver disease, unspecified: Secondary | ICD-10-CM

## 2013-06-05 DIAGNOSIS — R161 Splenomegaly, not elsewhere classified: Secondary | ICD-10-CM

## 2013-06-05 DIAGNOSIS — D696 Thrombocytopenia, unspecified: Secondary | ICD-10-CM

## 2013-06-05 DIAGNOSIS — E119 Type 2 diabetes mellitus without complications: Secondary | ICD-10-CM

## 2013-06-05 LAB — COMPREHENSIVE METABOLIC PANEL (CC13)
ALK PHOS: 141 U/L (ref 40–150)
ALT: 24 U/L (ref 0–55)
AST: 46 U/L — AB (ref 5–34)
Albumin: 3 g/dL — ABNORMAL LOW (ref 3.5–5.0)
Anion Gap: 11 mEq/L (ref 3–11)
BUN: 7.5 mg/dL (ref 7.0–26.0)
CHLORIDE: 107 meq/L (ref 98–109)
CO2: 24 mEq/L (ref 22–29)
Calcium: 9.4 mg/dL (ref 8.4–10.4)
Creatinine: 0.8 mg/dL (ref 0.7–1.3)
Glucose: 105 mg/dl (ref 70–140)
Potassium: 3.1 mEq/L — ABNORMAL LOW (ref 3.5–5.1)
Sodium: 142 mEq/L (ref 136–145)
Total Bilirubin: 1.93 mg/dL — ABNORMAL HIGH (ref 0.20–1.20)
Total Protein: 7.9 g/dL (ref 6.4–8.3)

## 2013-06-05 LAB — LACTATE DEHYDROGENASE (CC13): LDH: 183 U/L (ref 125–245)

## 2013-06-05 LAB — CBC & DIFF AND RETIC
BASO%: 0.6 % (ref 0.0–2.0)
Basophils Absolute: 0 10*3/uL (ref 0.0–0.1)
EOS ABS: 0.2 10*3/uL (ref 0.0–0.5)
EOS%: 5.1 % (ref 0.0–7.0)
HCT: 38.2 % — ABNORMAL LOW (ref 38.4–49.9)
HGB: 13.2 g/dL (ref 13.0–17.1)
IMMATURE RETIC FRACT: 4.4 % (ref 3.00–10.60)
LYMPH%: 45.7 % (ref 14.0–49.0)
MCH: 29.1 pg (ref 27.2–33.4)
MCHC: 34.6 g/dL (ref 32.0–36.0)
MCV: 84.1 fL (ref 79.3–98.0)
MONO#: 0.7 10*3/uL (ref 0.1–0.9)
MONO%: 15.2 % — ABNORMAL HIGH (ref 0.0–14.0)
NEUT%: 33.4 % — ABNORMAL LOW (ref 39.0–75.0)
NEUTROS ABS: 1.6 10*3/uL (ref 1.5–6.5)
PLATELETS: 64 10*3/uL — AB (ref 140–400)
RBC: 4.54 10*6/uL (ref 4.20–5.82)
RDW: 15.4 % — ABNORMAL HIGH (ref 11.0–14.6)
RETIC %: 1.78 % (ref 0.80–1.80)
Retic Ct Abs: 80.81 10*3/uL (ref 34.80–93.90)
WBC: 4.7 10*3/uL (ref 4.0–10.3)
lymph#: 2.2 10*3/uL (ref 0.9–3.3)
nRBC: 0 % (ref 0–0)

## 2013-06-05 LAB — FERRITIN CHCC: Ferritin: 51 ng/ml (ref 22–316)

## 2013-06-05 NOTE — Telephone Encounter (Signed)
gve the pt his nov 2015 appt calendar. Sent pt back to the lab.

## 2013-06-05 NOTE — Progress Notes (Signed)
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 male was transferred to my care after his prior physician has left.  I reviewed the patient's records extensive and collaborated the history with the patient. He is a very poor historian. Summary of his history is as follows: This patient had background history of diabetes. The patient presented with confusion in February of 2014 and was admitted to the hospital. CT scan of the abdomen and pelvis showed liver cirrhosis with splenomegaly. He was found to have iron deficiency anemia and was placed on oral ion supplements. The cause of liver cirrhosis is unknown. The patient denies alcoholism. 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  He is compliant of taking oral iron supplement daily. The patient denies any recent signs or symptoms of bleeding such as spontaneous epistaxis, hematuria or hematochezia.   MEDICAL HISTORY:  Past Medical History  Diagnosis Date  . Hypertension   . Hypothyroid   . Pancytopenia   . Cirrhosis   . Diabetes mellitus, type II     SURGICAL HISTORY: History reviewed. No pertinent past surgical history.  SOCIAL HISTORY: History   Social History  . Marital Status: Married    Spouse Name: N/A    Number of Children: 4  . Years of Education: N/A   Occupational History  .      [employer].    Social History Main Topics  . Smoking status: Never Smoker   . Smokeless tobacco: Never Used  . Alcohol Use: No     Comment: Occasional  . Drug Use: No  . Sexual Activity: Not on file    Other Topics Concern  . Not on file   Social History Narrative  . No narrative on file    FAMILY HISTORY: Family History  Problem Relation Age of Onset  . Asthma Father     ALLERGIES:  has No Known Allergies.  MEDICATIONS:  Current Outpatient Prescriptions  Medication Sig Dispense Refill  . Cholecalciferol (VITAMIN D3) 5000 UNITS CAPS Take 1 capsule by mouth daily.      . Ferrous Sulfate (PX IRON) 27 MG TABS Take 1 tablet by mouth daily.      . furosemide (LASIX) 20 MG tablet Take 20 mg by mouth daily.      Marland Kitchen levothyroxine (SYNTHROID) 112 MCG tablet Take 112 mcg by mouth daily before breakfast.      . metFORMIN (GLUCOPHAGE) 500 MG tablet Take 500 mg by mouth 2 (two) times daily with a meal.      . Multiple Minerals-Vitamins (CALCIUM & VIT D3 BONE HEALTH PO) Take 1 tablet by mouth daily.      . Multiple Vitamins-Minerals (MULTIVITAMIN WITH MINERALS) tablet Take 1 tablet by mouth daily.      . vitamin B-12 (CYANOCOBALAMIN) 1000 MCG tablet Take 1,000 mcg by mouth daily.      . cyclobenzaprine (FLEXERIL) 5 MG tablet       . methocarbamol (ROBAXIN) 750 MG tablet        No current facility-administered medications for this visit.    REVIEW OF SYSTEMS:   Constitutional: Denies fevers, chills or abnormal night sweats Eyes:  Denies blurriness of vision, double vision or watery eyes Ears, nose, mouth, throat, and face: Denies mucositis or sore throat Respiratory: Denies cough, dyspnea or wheezes Cardiovascular: Denies palpitation, chest discomfort or lower extremity swelling Gastrointestinal:  Denies nausea, heartburn or change in bowel habits Skin: Denies abnormal skin rashes Lymphatics: Denies new lymphadenopathy or easy bruising Neurological:Denies numbness, tingling or new weaknesses Behavioral/Psych: Mood is stable, no new changes  All other systems were reviewed with the patient and are negative.  PHYSICAL EXAMINATION: ECOG PERFORMANCE STATUS: 0 - Asymptomatic  Filed  Vitals:   06/05/13 1358  BP: 169/95  Pulse: 104  Temp: 98.1 F (36.7 C)  Resp: 20   Filed Weights   06/05/13 1358  Weight: 191 lb 3.2 oz (86.728 kg)    GENERAL:alert, no distress and comfortable SKIN: skin color, texture, turgor are normal, no rashes or significant lesions EYES: normal, conjunctiva are pink and non-injected, sclera clear OROPHARYNX:no exudate, normal lips, buccal mucosa, and tongue  NECK: supple, thyroid normal size, non-tender, without nodularity LYMPH:  no palpable lymphadenopathy in the cervical, axillary or inguinal LUNGS: clear to auscultation and percussion with normal breathing effort HEART: regular rate & rhythm and no murmurs without lower extremity edema ABDOMEN:abdomen soft, non-tender and normal bowel sounds. I am not able to appreciate splenomegaly Musculoskeletal:no cyanosis of digits and no clubbing  PSYCH: alert & oriented x 3 with fluent speech NEURO: no focal motor/sensory deficits  LABORATORY DATA:  I have reviewed the data as listed Lab Results  Component Value Date   WBC 4.7 06/05/2013   HGB 13.2 06/05/2013   HCT 38.2* 06/05/2013   MCV 84.1 06/05/2013   PLT 64* 06/05/2013    Recent Labs  06/05/13 1445  NA 142  K 3.1*  CO2 24  GLUCOSE 105  BUN 7.5  CREATININE 0.8  CALCIUM 9.4  PROT 7.9  ALBUMIN 3.0*  AST 46*  ALT 24  ALKPHOS 141  BILITOT 1.93*    RADIOGRAPHIC STUDIES: I reviewed his last CT scan from February 2014. I have personally reviewed the radiological images as listed and agreed with the findings in the report.   ASSESSMENT & PLAN:  #1 iron deficiency anemia The patient is no longer anemic. Iron studies revealed ferritin of 51. I recommend he continue oral iron supplement daily with goal of ferritin >100 #2 chronic Thrombocytopenia #3 liver cirrhosis Cause of his liver disease is unknown. Due to severe thrombocytopenia, I recommend following up in 6 months He has elevated alpha-fetoprotein in the past. I will  recheck it today. I recommend the patient continue close followup with the liver specialist. Orders Placed This Encounter  Procedures  . CBC & Diff and Retic    Standing Status: Standing     Number of Occurrences: 2     Standing Expiration Date: 06/06/2014  . AFP tumor marker    Standing Status: Future     Number of Occurrences: 1     Standing Expiration Date: 06/05/2014  . Comprehensive metabolic panel    Standing Status: Future     Number of Occurrences: 1     Standing Expiration Date: 06/05/2014  . Lactate dehydrogenase    Standing Status: Future     Number of Occurrences: 1     Standing Expiration Date: 06/05/2014  . Ferritin    Standing Status: Future     Number of Occurrences: 1     Standing Expiration Date: 06/05/2014  . SPEP & IFE with QIG    Standing Status:  Future     Number of Occurrences: 1     Standing Expiration Date: 06/05/2014  . Kappa/lambda light chains    Standing Status: Future     Number of Occurrences: 1     Standing Expiration Date: 06/05/2014    All questions were answered. The patient knows to call the clinic with any problems, questions or concerns. I spent 25 minutes counseling the patient face to face. The total time spent in the appointment was 30 minutes and more than 50% was on counseling.     Heath Lark, MD 06/05/2013 4:22 PM

## 2013-06-07 LAB — SPEP & IFE WITH QIG
ALPHA-1-GLOBULIN: 2.9 % (ref 2.9–4.9)
ALPHA-2-GLOBULIN: 6.4 % — AB (ref 7.1–11.8)
Albumin ELP: 44.6 % — ABNORMAL LOW (ref 55.8–66.1)
BETA GLOBULIN: 7.5 % — AB (ref 4.7–7.2)
Beta 2: 10.6 % — ABNORMAL HIGH (ref 3.2–6.5)
GAMMA GLOBULIN: 28 % — AB (ref 11.1–18.8)
IgA: 1290 mg/dL — ABNORMAL HIGH (ref 68–379)
IgG (Immunoglobin G), Serum: 1920 mg/dL — ABNORMAL HIGH (ref 650–1600)
IgM, Serum: 119 mg/dL (ref 41–251)
Total Protein, Serum Electrophoresis: 7.8 g/dL (ref 6.0–8.3)

## 2013-06-07 LAB — KAPPA/LAMBDA LIGHT CHAINS
KAPPA FREE LGHT CHN: 2.97 mg/dL — AB (ref 0.33–1.94)
Kappa:Lambda Ratio: 0.89 (ref 0.26–1.65)
Lambda Free Lght Chn: 3.34 mg/dL — ABNORMAL HIGH (ref 0.57–2.63)

## 2013-06-07 LAB — AFP TUMOR MARKER: AFP TUMOR MARKER: 19.2 ng/mL — AB (ref 0.0–8.0)

## 2013-06-28 ENCOUNTER — Ambulatory Visit (INDEPENDENT_AMBULATORY_CARE_PROVIDER_SITE_OTHER): Payer: No Typology Code available for payment source | Admitting: Gastroenterology

## 2013-06-28 ENCOUNTER — Other Ambulatory Visit (INDEPENDENT_AMBULATORY_CARE_PROVIDER_SITE_OTHER): Payer: No Typology Code available for payment source

## 2013-06-28 ENCOUNTER — Encounter: Payer: Self-pay | Admitting: Gastroenterology

## 2013-06-28 VITALS — BP 138/90 | HR 112 | Ht 65.5 in | Wt 196.4 lb

## 2013-06-28 DIAGNOSIS — Z23 Encounter for immunization: Secondary | ICD-10-CM

## 2013-06-28 DIAGNOSIS — K7460 Unspecified cirrhosis of liver: Secondary | ICD-10-CM

## 2013-06-28 LAB — IBC PANEL
IRON: 160 ug/dL (ref 42–165)
Saturation Ratios: 39.6 % (ref 20.0–50.0)
Transferrin: 288.7 mg/dL (ref 212.0–360.0)

## 2013-06-28 LAB — FERRITIN: Ferritin: 42.7 ng/mL (ref 22.0–322.0)

## 2013-06-28 LAB — IRON: IRON: 160 ug/dL (ref 42–165)

## 2013-06-28 NOTE — Progress Notes (Signed)
          History of Present Illness:  The patient has returned for followup of cirrhosis.  He has no GI complaints including change in weight, appetite, abdominal pain or pruritus.  Endoscopy one year ago did not demonstrate varices.  Colonoscopy was negative.  Recent lab work was pertinent for albumin 3.0 and platelet count 64.  No previous blood work is available.    Review of Systems: Pertinent positive and negative review of systems were noted in the above HPI section. All other review of systems were otherwise negative.    Current Medications, Allergies, Past Medical History, Past Surgical History, Family History and Social History were reviewed in [facility] record  Vital signs were reviewed in today's medical record. Physical Exam: General: Well developed , well nourished, no acute distress Skin: anicteric Head: Normocephalic and atraumatic Eyes:  sclerae anicteric, EOMI Ears: Normal auditory acuity Mouth: No deformity or lesions Lungs: Clear throughout to auscultation Heart: Regular rate and rhythm; no murmurs, rubs or bruits Abdomen: Soft, non tender and non distended. No masses, hepatosplenomegaly or hernias noted. Normal Bowel sounds Rectal:deferred Musculoskeletal: Symmetrical with no gross deformities  Pulses:  Normal pulses noted Extremities: No clubbing, cyanosis,  or deformities noted.  There is trace ankle edema Neurological: Alert oriented x 4, grossly nonfocal Psychological:  Alert and cooperative. Normal mood and affect  See Assessment and Plan under Problem List

## 2013-06-28 NOTE — Patient Instructions (Signed)
We have given you your 4th Twinrix injection today  Go to the basement for labs today  You have been scheduled for an endoscopy with propofol. Please follow written instructions given to you at your visit today. If you use inhalers (even only as needed), please bring them with you on the day of your procedure. Your physician has requested that you go to www.startemmi.com and enter the access code given to you at your visit today. This web site gives a general overview about your procedure. However, you should still follow specific instructions given to you by our office regarding your preparation for the procedure.

## 2013-06-28 NOTE — Assessment & Plan Note (Signed)
Patient appears to have stable cirrhosis with portal hypertension characterized by thrombocytopenia.  Endoscopy 2014 negative for varices.  Etiology is uncertain.  Recommendations #1 repeat request for ANA, AMA, alpha one anti-jimson level, ceruloplasmin level, iron, TIBC and ferritin #2 followup endoscopy

## 2013-06-29 ENCOUNTER — Ambulatory Visit (AMBULATORY_SURGERY_CENTER): Payer: No Typology Code available for payment source | Admitting: Gastroenterology

## 2013-06-29 ENCOUNTER — Encounter: Payer: Self-pay | Admitting: Gastroenterology

## 2013-06-29 VITALS — BP 117/76 | HR 76 | Temp 97.7°F | Resp 16 | Ht 65.0 in | Wt 196.0 lb

## 2013-06-29 DIAGNOSIS — I8500 Esophageal varices without bleeding: Secondary | ICD-10-CM

## 2013-06-29 DIAGNOSIS — K7460 Unspecified cirrhosis of liver: Secondary | ICD-10-CM

## 2013-06-29 LAB — GLUCOSE, CAPILLARY
GLUCOSE-CAPILLARY: 111 mg/dL — AB (ref 70–99)
GLUCOSE-CAPILLARY: 102 mg/dL — AB (ref 70–99)

## 2013-06-29 LAB — ANA: Anti Nuclear Antibody(ANA): NEGATIVE

## 2013-06-29 MED ORDER — SODIUM CHLORIDE 0.9 % IV SOLN: 500.0000 mL | INTRAVENOUS | Status: DC

## 2013-06-29 MED ORDER — NADOLOL 40 MG PO TABS: 40.0000 mg | ORAL_TABLET | Freq: Every day | ORAL | Status: AC

## 2013-06-29 NOTE — Progress Notes (Addendum)
Pt has harsh, frequent, productive couch for about 20 minutes after arriving to RR.  Oxygen sats 96-98% and pt denies SOB.  Breath sounds clear t/o.  He has no cough at discharge and is able to sip small amount of water without difficulty.  No complaints at discharge.  Throat and chest area soft, no air pockets noted by touch

## 2013-06-29 NOTE — Patient Instructions (Signed)
YOU HAD AN ENDOSCOPIC PROCEDURE TODAY AT [facility]: Refer to the procedure report that was given to you for any specific questions about what was found during the examination.  If the procedure report does not answer your questions, please call your gastroenterologist to clarify.  If you requested that your care partner not be given the details of your procedure findings, then the procedure report has been included in a sealed envelope for you to review at your convenience later.  YOU SHOULD EXPECT: Some feelings of bloating in the abdomen. Passage of more gas than usual.  Walking can help get rid of the air that was put into your GI tract during the procedure and reduce the bloating.  DIET: Your first meal following the procedure should be a light meal and then it is ok to progress to your normal diet.  A half-sandwich or bowl of soup is an example of a good first meal.  Heavy or fried foods are harder to digest and may make you feel nauseous or bloated.  Likewise meals heavy in dairy and vegetables can cause extra gas to form and this can also increase the bloating.  Drink plenty of fluids but you should avoid alcoholic beverages for 24 hours.  ACTIVITY: Your care partner should take you home directly after the procedure.  You should plan to take it easy, moving slowly for the rest of the day.  You can resume normal activity the day after the procedure however you should NOT DRIVE or use heavy machinery for 24 hours (because of the sedation medicines used during the test).    SYMPTOMS TO REPORT IMMEDIATELY: A gastroenterologist can be reached at any hour.  During normal business hours, 8:30 AM to 5:00 PM Monday through Friday, call [phone].  After hours and on weekends, please call the GI answering service at [phone] who will take a message and have the physician on call contact you.   Following upper endoscopy (EGD)  Vomiting of blood or coffee ground material  New  chest pain or pain under the shoulder blades  Painful or persistently difficult swallowing  New shortness of breath  Fever of 100F or higher  Black, tarry-looking stools  FOLLOW UP:.  Our staff will call the home number listed on your records the next business day following your procedure to check on you and address any questions or concerns that you may have at that time regarding the information given to you following your procedure. This is a courtesy call and so if there is no answer at the home number and we have not heard from you through the emergency physician on call, we will assume that you have returned to your regular daily activities without incident.  SIGNATURES/CONFIDENTIALITY: You and/or your care partner have signed paperwork which will be entered into your electronic medical record.  These signatures attest to the fact that that the information above on your After Visit Summary has been reviewed and is understood.  Full responsibility of the confidentiality of this discharge information lies with you and/or your care-partner.  Your prescription was sent to your [facility] pharmacy  Dr. Kelby Fam office nurse will call you in the next few days to set up a follow up office visit for 12 weeks

## 2013-06-29 NOTE — Op Note (Signed)
[facility]  [address] [place], [address]   ENDOSCOPY PROCEDURE REPORT  PATIENT: Jonathon Cannon, Jonathon Cannon  MR#: [id number] BIRTHDATE: Apr 15, 1950 , 62  yrs. old GENDER: Male ENDOSCOPIST: Inda Castle, MD REFERRED BY: PROCEDURE DATE:  06/29/2013 PROCEDURE:  EGD, diagnostic ASA CLASS:     Class II INDICATIONS:  Screening for varices. MEDICATIONS: MAC sedation, administered by CRNA, propofol (Diprivan) 150mg  IV, and Simethicone 0.6cc PO TOPICAL ANESTHETIC: Cetacaine Spray  DESCRIPTION OF PROCEDURE: After the risks benefits and alternatives of the procedure were thoroughly explained, informed consent was obtained.  The LB [id number] endoscope was introduced through the mouth and advanced to the third portion of the duodenum. Without limitations.  The instrument was slowly withdrawn as the mucosa was fully examined.      In the lower third of the esophagus, arising from the GE junction and extending 2-3 cm proximally, there were trace esophageal varices.   The remainder of the upper endoscopy exam was otherwise normal.  Retroflexed views revealed no abnormalities.     The scope was then withdrawn from the patient and the procedure completed.  COMPLICATIONS: There were no complications. ENDOSCOPIC IMPRESSION: 1.   trace esophageal varices  RECOMMENDATIONS: 1.  begin nadolol 40 mg daily 2.  office visit 12 weeks REPEAT EXAM:  eSigned:  Inda Castle, MD 06/29/2013 3:12 PM   CC: Bonnielee Haff, MD

## 2013-06-30 ENCOUNTER — Telehealth: Payer: Self-pay | Admitting: *Deleted

## 2013-06-30 NOTE — Telephone Encounter (Signed)
  Follow up Call-  Call back number 06/29/2013 06/09/2012 04/28/2012  Post procedure Call Back phone  # [phone]  Permission to leave phone message Yes Yes Yes     Patient questions:  Do you have a fever, pain , or abdominal swelling? no Pain Score  0 *  Have you tolerated food without any problems? yes  Have you been able to return to your normal activities? yes  Do you have any questions about your discharge instructions: Diet   no Medications  no Follow up visit  no  Do you have questions or concerns about your Care? no  Actions: * If pain score is 4 or above: No action needed, pain <4.

## 2013-07-03 LAB — ALPHA-1-ANTITRYPSIN: A1 ANTITRYPSIN SER: 141 mg/dL (ref 83–199)

## 2013-07-03 LAB — CERULOPLASMIN: Ceruloplasmin: 22 mg/dL (ref 18–36)

## 2013-07-03 LAB — MITOCHONDRIAL ANTIBODIES: MITOCHONDRIAL M2 AB, IGG: 1.05 — AB (ref <0.91)

## 2013-08-14 ENCOUNTER — Telehealth: Payer: Self-pay | Admitting: Gastroenterology

## 2013-08-14 NOTE — Telephone Encounter (Signed)
Ok noted will disregard fax

## 2013-08-28 ENCOUNTER — Emergency Department (HOSPITAL_COMMUNITY): Payer: No Typology Code available for payment source

## 2013-08-28 ENCOUNTER — Emergency Department (HOSPITAL_COMMUNITY)
Admission: EM | Admit: 2013-08-28 | Discharge: 2013-08-28 | Disposition: A | Discharge status: Home / Self Care | Payer: No Typology Code available for payment source | Attending: Emergency Medicine | Admitting: Emergency Medicine

## 2013-08-28 ENCOUNTER — Encounter (HOSPITAL_COMMUNITY): Payer: Self-pay | Admitting: Radiology

## 2013-08-28 DIAGNOSIS — S199XXA Unspecified injury of neck, initial encounter: Secondary | ICD-10-CM

## 2013-08-28 DIAGNOSIS — S139XXA Sprain of joints and ligaments of unspecified parts of neck, initial encounter: Secondary | ICD-10-CM | POA: Diagnosis not present

## 2013-08-28 DIAGNOSIS — E119 Type 2 diabetes mellitus without complications: Secondary | ICD-10-CM | POA: Diagnosis not present

## 2013-08-28 DIAGNOSIS — S161XXA Strain of muscle, fascia and tendon at neck level, initial encounter: Secondary | ICD-10-CM

## 2013-08-28 DIAGNOSIS — S3981XA Other specified injuries of abdomen, initial encounter: Secondary | ICD-10-CM | POA: Diagnosis not present

## 2013-08-28 DIAGNOSIS — I10 Essential (primary) hypertension: Secondary | ICD-10-CM | POA: Insufficient documentation

## 2013-08-28 DIAGNOSIS — S39012A Strain of muscle, fascia and tendon of lower back, initial encounter: Secondary | ICD-10-CM

## 2013-08-28 DIAGNOSIS — Y9389 Activity, other specified: Secondary | ICD-10-CM | POA: Insufficient documentation

## 2013-08-28 DIAGNOSIS — Z8719 Personal history of other diseases of the digestive system: Secondary | ICD-10-CM | POA: Diagnosis not present

## 2013-08-28 DIAGNOSIS — S0993XA Unspecified injury of face, initial encounter: Secondary | ICD-10-CM | POA: Insufficient documentation

## 2013-08-28 DIAGNOSIS — S335XXA Sprain of ligaments of lumbar spine, initial encounter: Secondary | ICD-10-CM | POA: Insufficient documentation

## 2013-08-28 DIAGNOSIS — Z79899 Other long term (current) drug therapy: Secondary | ICD-10-CM | POA: Diagnosis not present

## 2013-08-28 DIAGNOSIS — E039 Hypothyroidism, unspecified: Secondary | ICD-10-CM | POA: Diagnosis not present

## 2013-08-28 DIAGNOSIS — Y92410 Unspecified street and highway as the place of occurrence of the external cause: Secondary | ICD-10-CM | POA: Insufficient documentation

## 2013-08-28 LAB — I-STAT CHEM 8, ED
BUN: 4 mg/dL — ABNORMAL LOW (ref 6–23)
Calcium, Ion: 1.19 mmol/L (ref 1.13–1.30)
Chloride: 104 mEq/L (ref 96–112)
Creatinine, Ser: 0.8 mg/dL (ref 0.50–1.35)
Glucose, Bld: 243 mg/dL — ABNORMAL HIGH (ref 70–99)
HCT: 38 % — ABNORMAL LOW (ref 39.0–52.0)
Hemoglobin: 12.9 g/dL — ABNORMAL LOW (ref 13.0–17.0)
Potassium: 3.7 mEq/L (ref 3.7–5.3)
Sodium: 140 mEq/L (ref 137–147)
TCO2: 25 mmol/L (ref 0–100)

## 2013-08-28 MED ORDER — HYDROCODONE-ACETAMINOPHEN 5-325 MG PO TABS: 1.0000 | ORAL_TABLET | Freq: Four times a day (QID) | ORAL | Status: AC | PRN

## 2013-08-28 MED ORDER — IOHEXOL 300 MG/ML  SOLN
100.0000 mL | Freq: Once | INTRAMUSCULAR | Status: AC | PRN
  Administered 2013-08-28: 100 mL via INTRAVENOUS

## 2013-08-28 NOTE — ED Provider Notes (Signed)
Medical screening examination/treatment/procedure(s) were performed by non-physician practitioner and as supervising physician I was immediately available for consultation/collaboration.   EKG Interpretation None       Orpah Greek, MD 08/28/13 [phone]

## 2013-08-28 NOTE — ED Notes (Signed)
Pt alert x4 respirations easy non labored.  

## 2013-08-28 NOTE — Discharge Instructions (Signed)
Return here as needed. Follow up with your doctor. Your CT scans were normal
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
## 2013-09-18 ENCOUNTER — Ambulatory Visit: Payer: No Typology Code available for payment source | Admitting: Gastroenterology

## 2013-11-20 ENCOUNTER — Ambulatory Visit: Payer: No Typology Code available for payment source | Admitting: Gastroenterology

## 2013-12-07 ENCOUNTER — Telehealth: Payer: Self-pay | Admitting: *Deleted

## 2013-12-07 NOTE — Telephone Encounter (Signed)
Daughter called to confirm pt's appt tomorrow.

## 2013-12-08 ENCOUNTER — Other Ambulatory Visit (HOSPITAL_BASED_OUTPATIENT_CLINIC_OR_DEPARTMENT_OTHER): Payer: No Typology Code available for payment source

## 2013-12-08 ENCOUNTER — Encounter: Payer: Self-pay | Admitting: Hematology and Oncology

## 2013-12-08 ENCOUNTER — Telehealth: Payer: Self-pay | Admitting: Hematology and Oncology

## 2013-12-08 ENCOUNTER — Ambulatory Visit (HOSPITAL_BASED_OUTPATIENT_CLINIC_OR_DEPARTMENT_OTHER): Payer: No Typology Code available for payment source | Admitting: Hematology and Oncology

## 2013-12-08 VITALS — BP 153/77 | HR 92 | Temp 98.1°F | Resp 20 | Wt 208.8 lb

## 2013-12-08 DIAGNOSIS — K769 Liver disease, unspecified: Secondary | ICD-10-CM

## 2013-12-08 DIAGNOSIS — D509 Iron deficiency anemia, unspecified: Secondary | ICD-10-CM

## 2013-12-08 DIAGNOSIS — D696 Thrombocytopenia, unspecified: Secondary | ICD-10-CM

## 2013-12-08 DIAGNOSIS — R161 Splenomegaly, not elsewhere classified: Secondary | ICD-10-CM

## 2013-12-08 LAB — CBC & DIFF AND RETIC
BASO%: 1 % (ref 0.0–2.0)
Basophils Absolute: 0.1 10*3/uL (ref 0.0–0.1)
EOS%: 4.9 % (ref 0.0–7.0)
Eosinophils Absolute: 0.2 10*3/uL (ref 0.0–0.5)
HCT: 38.8 % (ref 38.4–49.9)
HGB: 13.3 g/dL (ref 13.0–17.1)
Immature Retic Fract: 7 % (ref 3.00–10.60)
LYMPH%: 49.1 % — ABNORMAL HIGH (ref 14.0–49.0)
MCH: 30 pg (ref 27.2–33.4)
MCHC: 34.3 g/dL (ref 32.0–36.0)
MCV: 87.4 fL (ref 79.3–98.0)
MONO#: 0.7 10*3/uL (ref 0.1–0.9)
MONO%: 13.6 % (ref 0.0–14.0)
NEUT#: 1.5 10*3/uL (ref 1.5–6.5)
NEUT%: 31.4 % — ABNORMAL LOW (ref 39.0–75.0)
Platelets: 68 10*3/uL — ABNORMAL LOW (ref 140–400)
RBC: 4.44 10*6/uL (ref 4.20–5.82)
RDW: 15.2 % — ABNORMAL HIGH (ref 11.0–14.6)
RETIC %: 1.62 % (ref 0.80–1.80)
RETIC CT ABS: 71.93 10*3/uL (ref 34.80–93.90)
WBC: 4.9 10*3/uL (ref 4.0–10.3)
lymph#: 2.4 10*3/uL (ref 0.9–3.3)

## 2013-12-08 NOTE — Progress Notes (Signed)
[place], MD SUMMARY OF HEMATOLOGIC HISTORY:  I reviewed the patient's records extensive and collaborated the history with the patient. He is a very poor historian. Summary of his history is as follows: This patient had background history of diabetes. The patient presented with confusion in February of 2014 and was admitted to the hospital. CT scan of the abdomen and pelvis showed liver cirrhosis with splenomegaly. He was found to have iron deficiency anemia and was placed on oral ion supplements. The cause of liver cirrhosis is unknown. The patient denies alcoholism. 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  INTERVAL HISTORY: Jonathon Cannon 63 y.o. male returns for further follow-up. He feels well. The patient denies any recent signs or symptoms of bleeding such as spontaneous epistaxis, hematuria or hematochezia.   I have reviewed the past medical history, past surgical history, social history and family history with the patient and they are unchanged from previous note.  ALLERGIES:  has No Known Allergies.  MEDICATIONS:  Current Outpatient Prescriptions  Medication Sig Dispense Refill  . Cholecalciferol (VITAMIN D3) 5000 UNITS CAPS Take 1 capsule by mouth daily.    . cyclobenzaprine (FLEXERIL) 5 MG tablet     . Ferrous Sulfate (PX IRON) 27 MG TABS Take 1 tablet by mouth daily.    . furosemide (LASIX) 20 MG tablet Take 20 mg by mouth daily.    Marland Kitchen HYDROcodone-acetaminophen (NORCO/VICODIN) 5-325 MG per tablet Take 1 tablet by mouth every 6 (six) hours as needed for moderate pain. 15 tablet 0  . levothyroxine (SYNTHROID, LEVOTHROID) 125 MCG tablet Take 125 mcg by mouth daily before breakfast.    . metFORMIN (GLUCOPHAGE) 500 MG tablet Take 500 mg by mouth 2 (two) times daily with a meal.    . methocarbamol (ROBAXIN) 750 MG tablet     . metoprolol succinate (TOPROL-XL) 25 MG 24 hr  tablet     . Multiple Vitamins-Minerals (MULTIVITAMIN WITH MINERALS) tablet Take 1 tablet by mouth daily.    . nadolol (CORGARD) 40 MG tablet Take 1 tablet (40 mg total) by mouth daily. 30 tablet 5  . vitamin B-12 (CYANOCOBALAMIN) 1000 MCG tablet Take 1,000 mcg by mouth daily.     No current facility-administered medications for this visit.     REVIEW OF SYSTEMS:   Constitutional: Denies fevers, chills or night sweats Eyes: Denies blurriness of vision Ears, nose, mouth, throat, and face: Denies mucositis or sore throat Respiratory: Denies cough, dyspnea or wheezes Cardiovascular: Denies palpitation, chest discomfort or lower extremity swelling Gastrointestinal:  Denies nausea, heartburn or change in bowel habits Skin: Denies abnormal skin rashes Lymphatics: Denies new lymphadenopathy or easy bruising Neurological:Denies numbness, tingling or new weaknesses Behavioral/Psych: Mood is stable, no new changes  All other systems were reviewed with the patient and are negative.  PHYSICAL EXAMINATION: ECOG PERFORMANCE STATUS: 0 - Asymptomatic  Filed Vitals:   12/08/13 1251  BP: 153/77  Pulse: 92  Temp: 98.1 F (36.7 C)  Resp: 20   Filed Weights   12/08/13 1251  Weight: 208 lb 12.8 oz (94.711 kg)    GENERAL:alert, no distress and comfortable SKIN: skin color, texture, turgor are normal, no rashes or significant lesions EYES: normal, Conjunctiva are pink and non-injected, sclera clear OROPHARYNX:no exudate, no erythema and lips, buccal mucosa, and tongue normal  NECK: supple, thyroid normal size, non-tender, without nodularity LYMPH:  no palpable lymphadenopathy in the cervical, axillary or inguinal LUNGS:  clear to auscultation and percussion with normal breathing effort HEART: regular rate & rhythm and no murmurs and no lower extremity edema ABDOMEN:abdomen soft, non-tender and normal bowel sounds. No evidence of ascites Musculoskeletal:no cyanosis of digits and no clubbing   NEURO: alert & oriented x 3 with fluent speech, no focal motor/sensory deficits  LABORATORY DATA:  I have reviewed the data as listed Results for orders placed or performed in visit on 12/08/13 (from the past 48 hour(s))  CBC & Diff and Retic     Status: Abnormal   Collection Time: 12/08/13 12:34 PM  Result Value Ref Range   WBC 4.9 4.0 - 10.3 10e3/uL   NEUT# 1.5 1.5 - 6.5 10e3/uL   HGB 13.3 13.0 - 17.1 g/dL   HCT 38.8 38.4 - 49.9 %   Platelets 68 (L) 140 - 400 10e3/uL   MCV 87.4 79.3 - 98.0 fL   MCH 30.0 27.2 - 33.4 pg   MCHC 34.3 32.0 - 36.0 g/dL   RBC 4.44 4.20 - 5.82 10e6/uL   RDW 15.2 (H) 11.0 - 14.6 %   lymph# 2.4 0.9 - 3.3 10e3/uL   MONO# 0.7 0.1 - 0.9 10e3/uL   Eosinophils Absolute 0.2 0.0 - 0.5 10e3/uL   Basophils Absolute 0.1 0.0 - 0.1 10e3/uL   NEUT% 31.4 (L) 39.0 - 75.0 %   LYMPH% 49.1 (H) 14.0 - 49.0 %   MONO% 13.6 0.0 - 14.0 %   EOS% 4.9 0.0 - 7.0 %   BASO% 1.0 0.0 - 2.0 %   Retic % 1.62 0.80 - 1.80 %   Retic Ct Abs 71.93 34.80 - 93.90 10e3/uL   Immature Retic Fract 7.00 3.00 - 10.60 %    Lab Results  Component Value Date   WBC 4.9 12/08/2013   HGB 13.3 12/08/2013   HCT 38.8 12/08/2013   MCV 87.4 12/08/2013   PLT 68* 12/08/2013   ASSESSMENT & PLAN:  Thrombocytopenia This is related to liver disease and splenomegaly. It has been very stable. Plan to see him back a year from now and if his blood work remains stable, we can discharge him from the clinic.   All questions were answered. The patient knows to call the clinic with any problems, questions or concerns. No barriers to learning was detected.  I spent 15 minutes counseling the patient face to face. The total time spent in the appointment was 20 minutes and more than 50% was on counseling.     [place], [place], MD 12/08/2013 1:30 PM

## 2013-12-08 NOTE — Telephone Encounter (Signed)
Gave avs & cal for Nov 2016. °

## 2013-12-08 NOTE — Assessment & Plan Note (Signed)
This is related to liver disease and splenomegaly. It has been very stable. Plan to see him back a year from now and if his blood work remains stable, we can discharge him from the clinic.

## 2014-03-04 IMAGING — CT CT ABD-PELV W/ CM
2 of 5 series · 17 of 46 positions shown, 19 images · IV contrast (APPLIED)
Comparison: None.

CLINICAL DATA: Fever, abdominal pain.

CT ABDOMEN AND PELVIS WITH CONTRAST
TECHNIQUE: Multidetector CT imaging of the abdomen and pelvis was
performed following the standard protocol during bolus
administration of intravenous contrast.
Contrast: 100mL OMNIPAQUE IOHEXOL 300 MG/ML  SOLN

[Series 2: abd/pelv with 5.0 b31f st · axial · 0.80mm/px · z∈[-486,-40]mm · 14 of 101 slices shown, 16 images]
[im 6/101  soft-tissue]
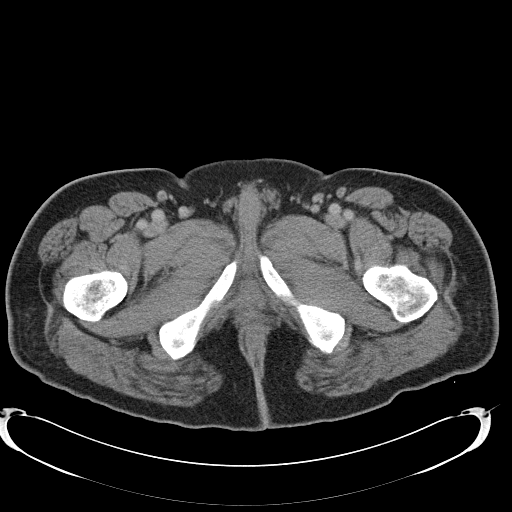
[im 6/101  bone]
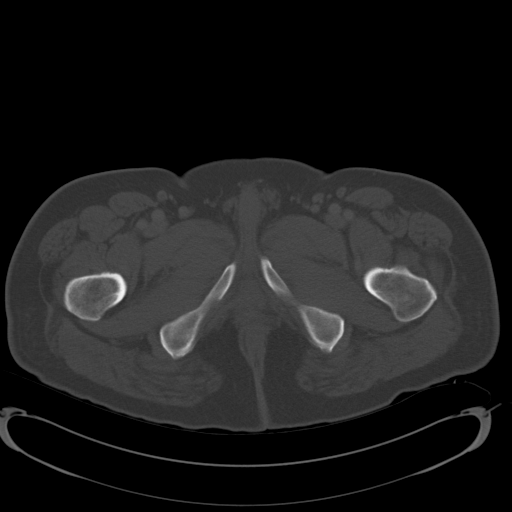
[im 11/101  soft-tissue]
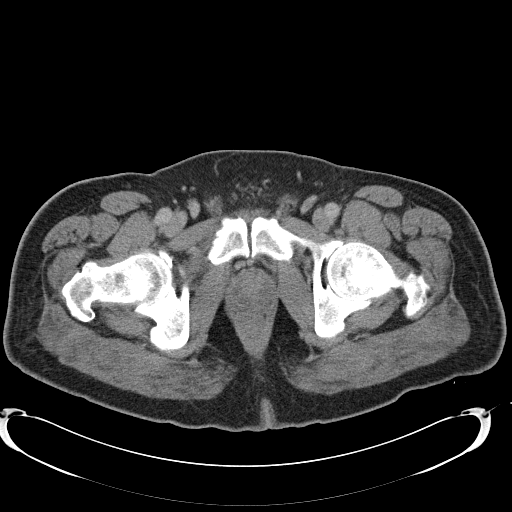
[im 22/101  soft-tissue]
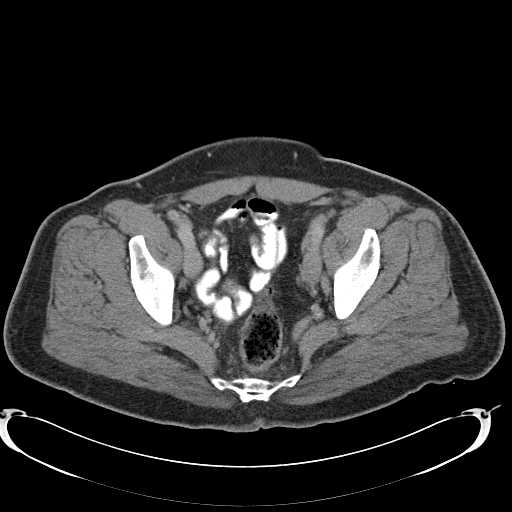
[im 27/101  soft-tissue]
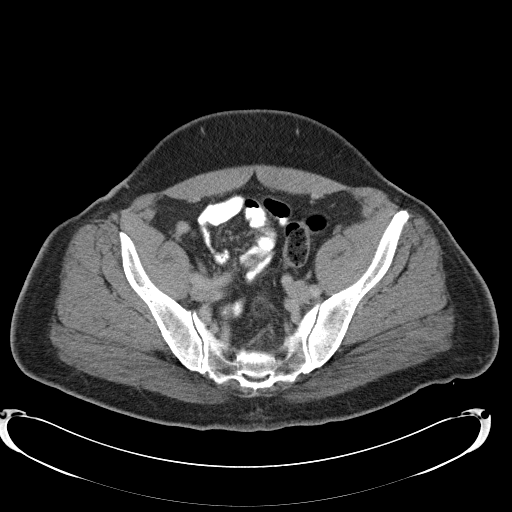
[im 32/101  soft-tissue]
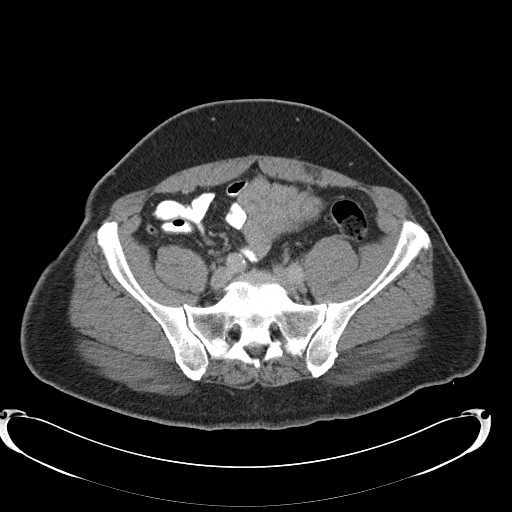
[im 43/101  soft-tissue]
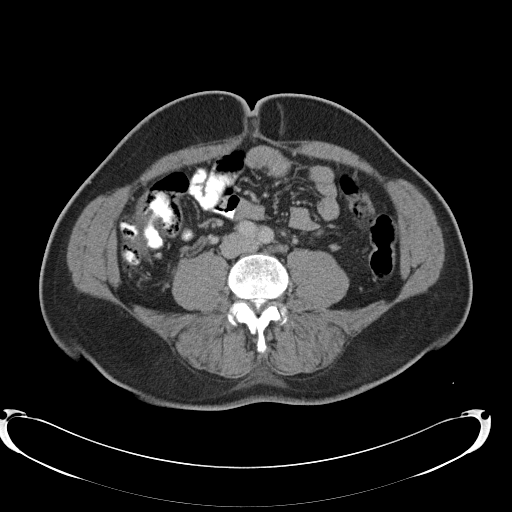
[im 48/101  soft-tissue]
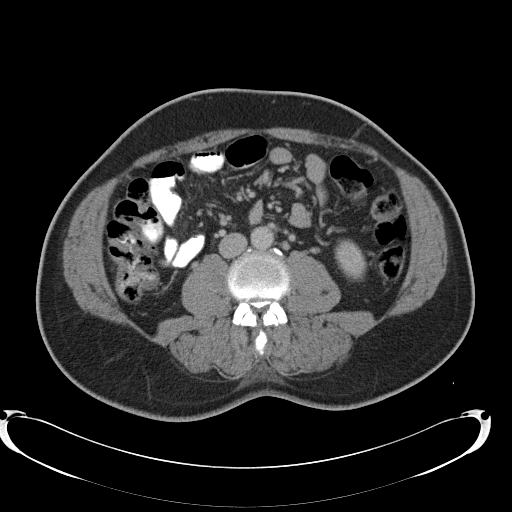
[im 53/101  soft-tissue]
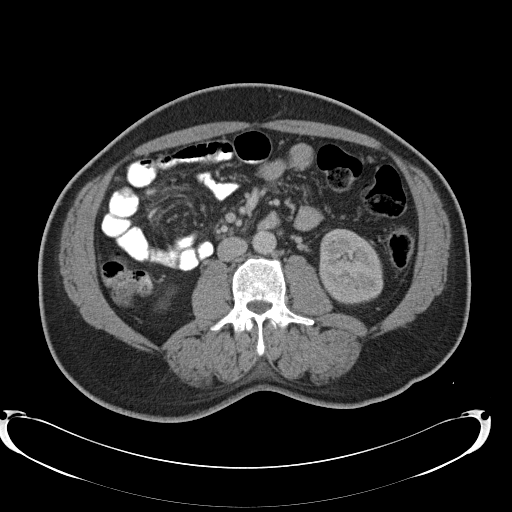
[im 58/101  soft-tissue]
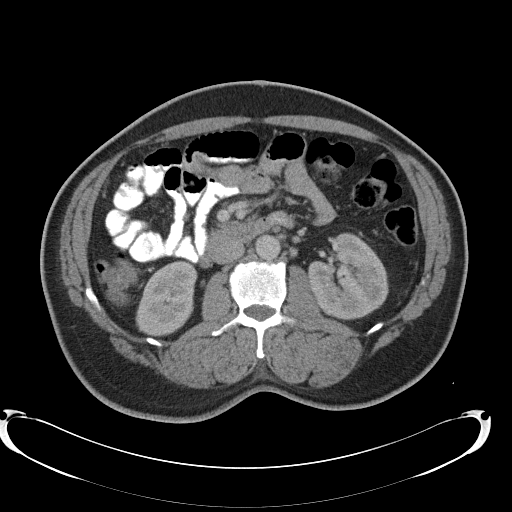
[im 58/101  bone]
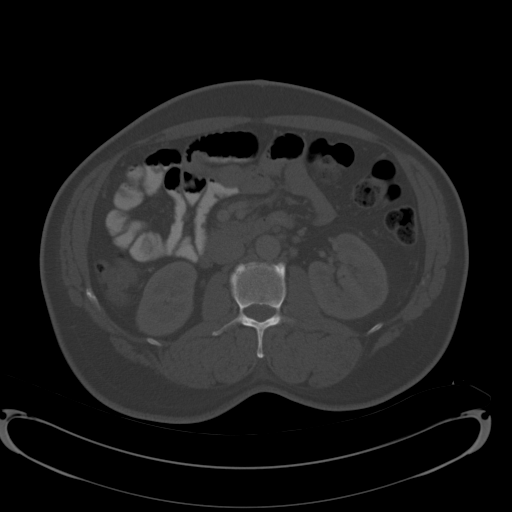
[im 69/101  soft-tissue]
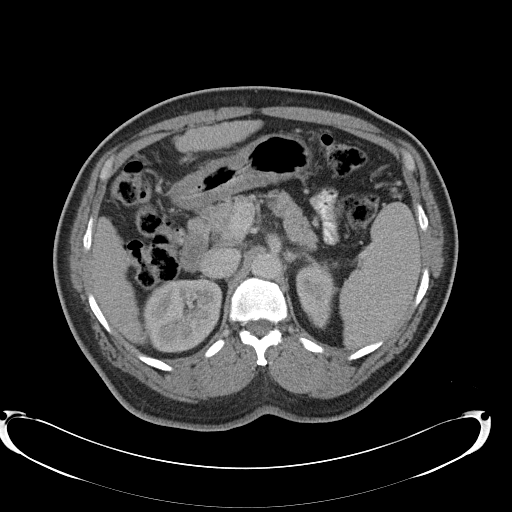
[im 74/101  soft-tissue]
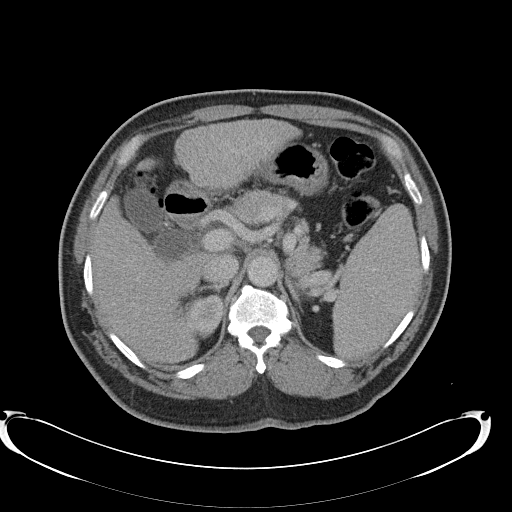
[im 79/101  soft-tissue]
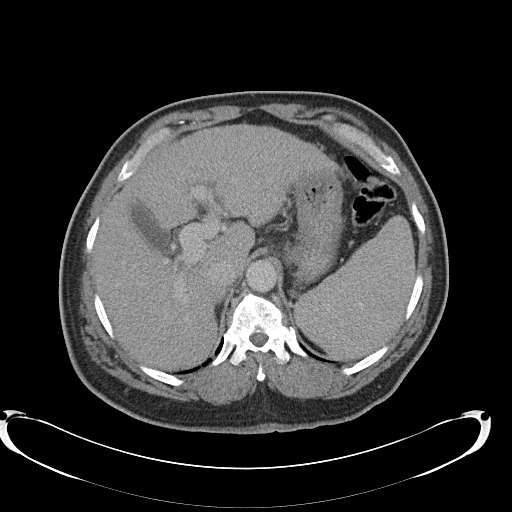
[im 90/101  soft-tissue]
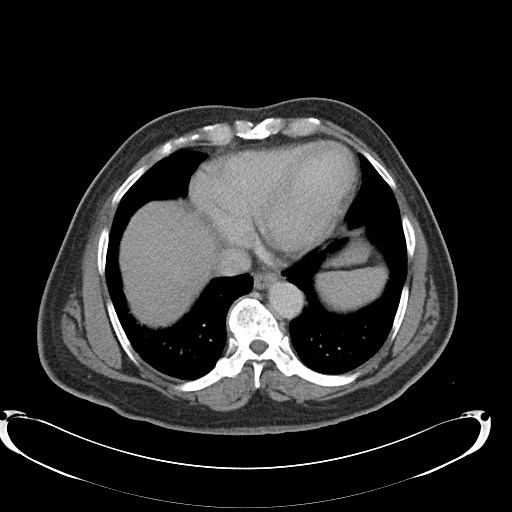
[im 95/101  soft-tissue]
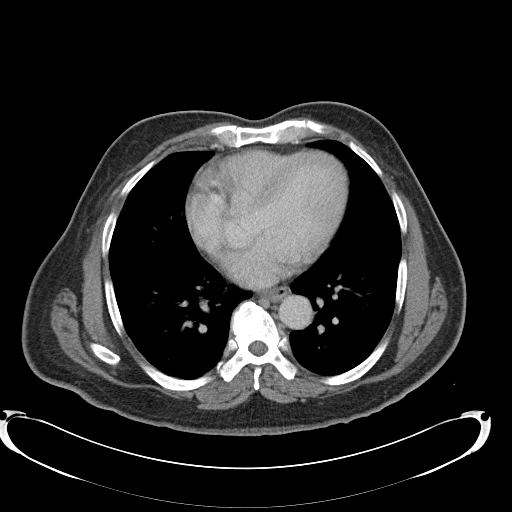

[Series 5: coronals · coronal · 0.98mm/px · 3 of 117 slices shown]
[im 39/117  soft-tissue]
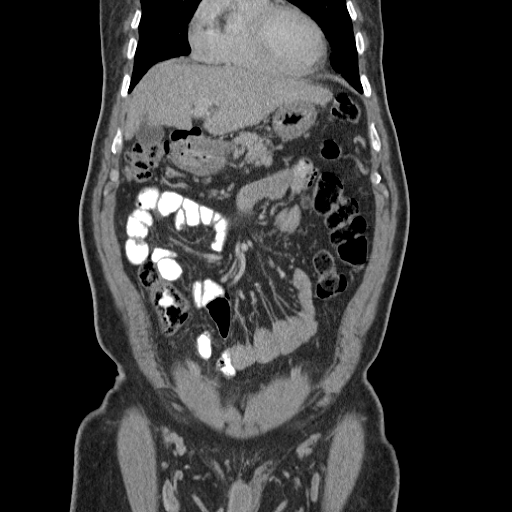
[im 52/117  soft-tissue]
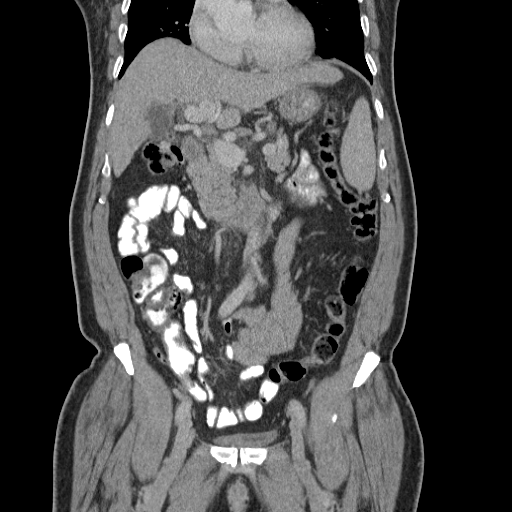
[im 65/117  soft-tissue]
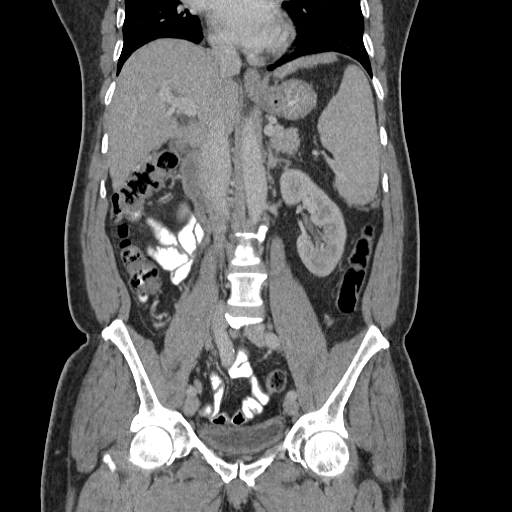

[17 of 46 positions shown; findings below may reference images not displayed]

FINDINGS: Visualized lung bases clear.  Liver has a nodular
contour, without focal lesion or intrahepatic biliary ductal
dilatation.  Gallbladder is nondistended.  Splenomegaly, 16.2 cm
craniocaudal length.  Unremarkable adrenal glands, pancreas,
kidneys.  Patchy aortoiliac plaque without aneurysm.  Stomach,
small bowel, and colon are nondilated.  Normal appendix.  No
ascites.  Urinary bladder incompletely distended.  Bilateral pelvic
phleboliths.  No of free air.  Mild spondylitic changes in the
lower lumbar spine.
IMPRESSION: 1.  Nodular liver contour suggesting cirrhosis, with splenomegaly
but no other stigmata of portal venous hypertension.
2.  No acute abdominal process.

## 2014-03-05 IMAGING — US US ABDOMEN COMPLETE
1 series · 13 of 25 positions shown · non-contrast
Comparison: CT scan 03/21/2012.

CLINICAL DATA: Cirrhosis.  Urinary tract infection.

COMPLETE ABDOMINAL ULTRASOUND

[Series 1: us abdomen complete · 0.32mm/px · 13 of 84 slices shown]
[im 1/84]
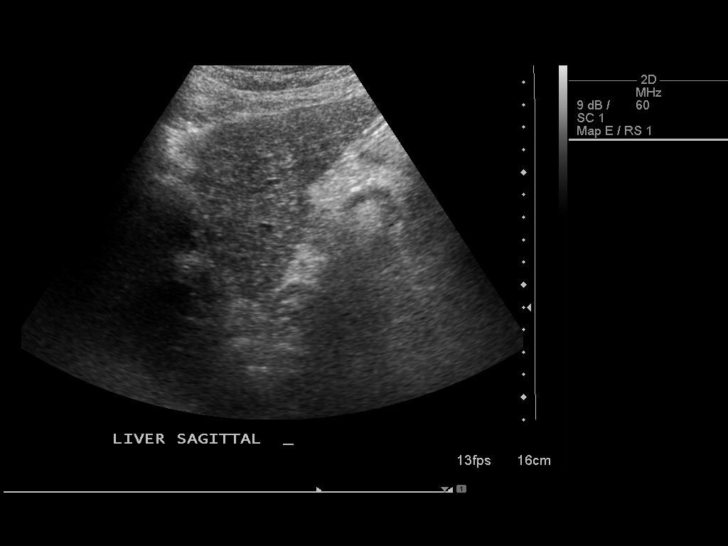
[im 7/84]
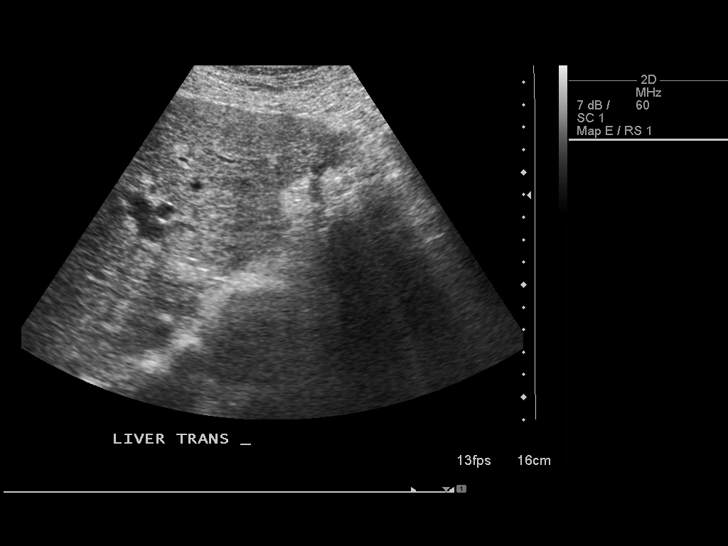
[im 14/84]
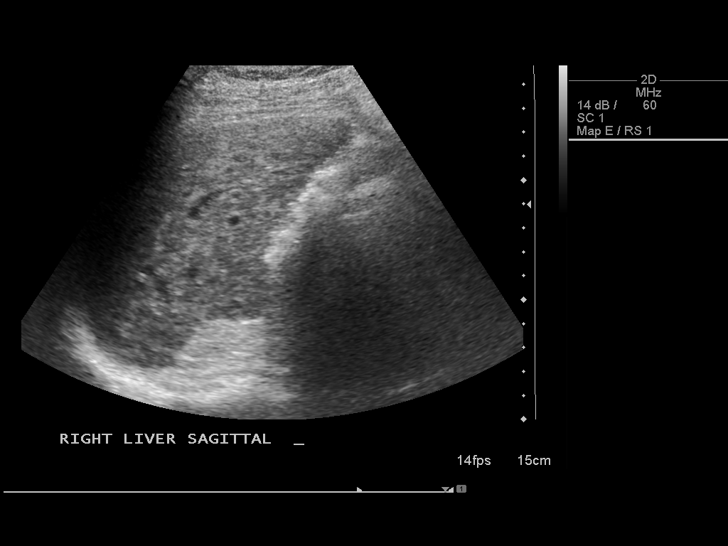
[im 21/84]
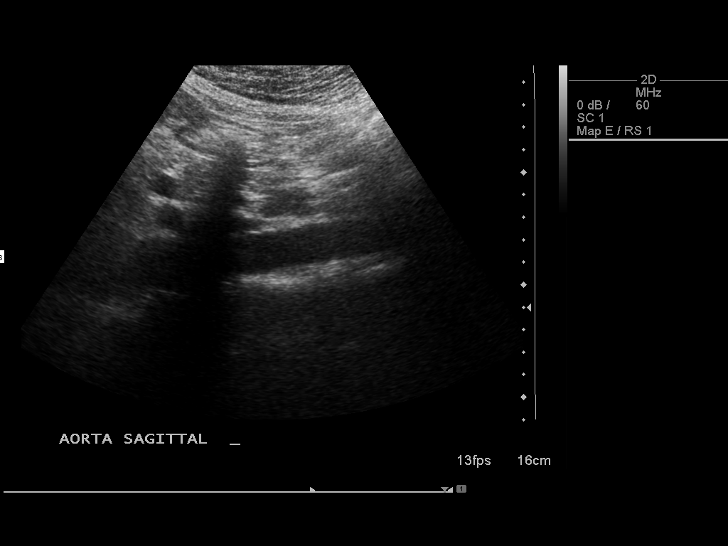
[im 28/84]
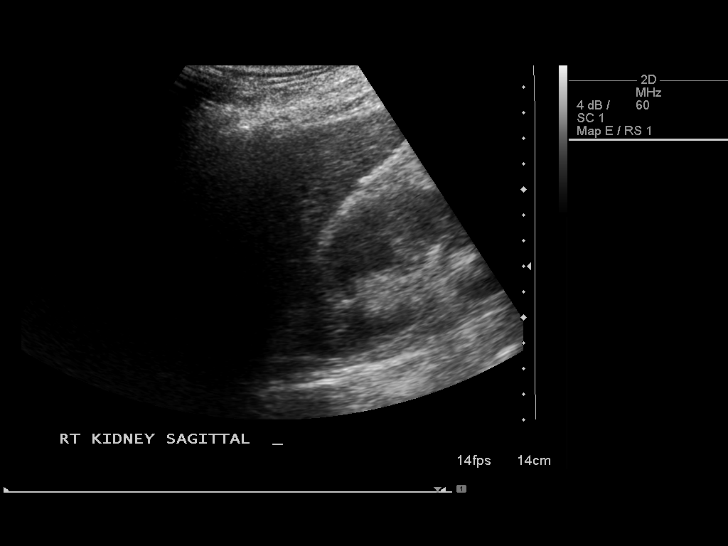
[im 35/84]
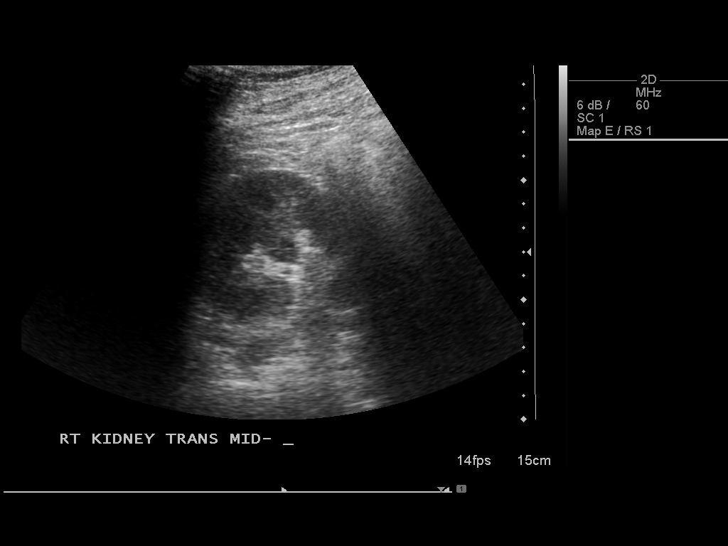
[im 42/84]
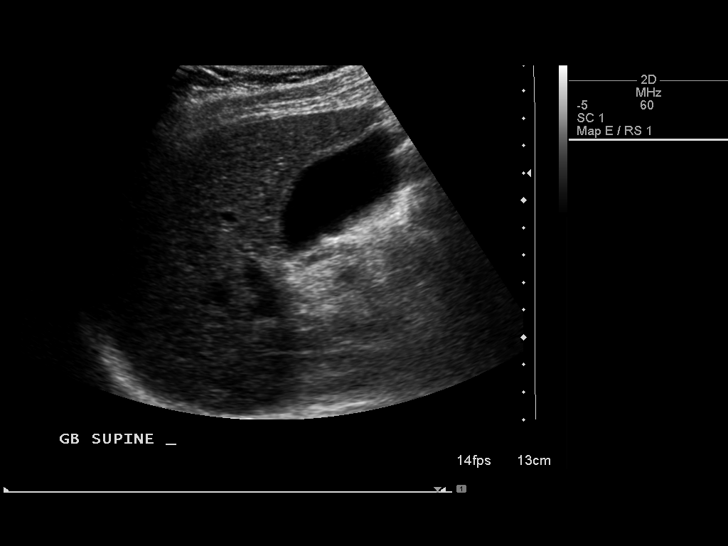
[im 49/84]
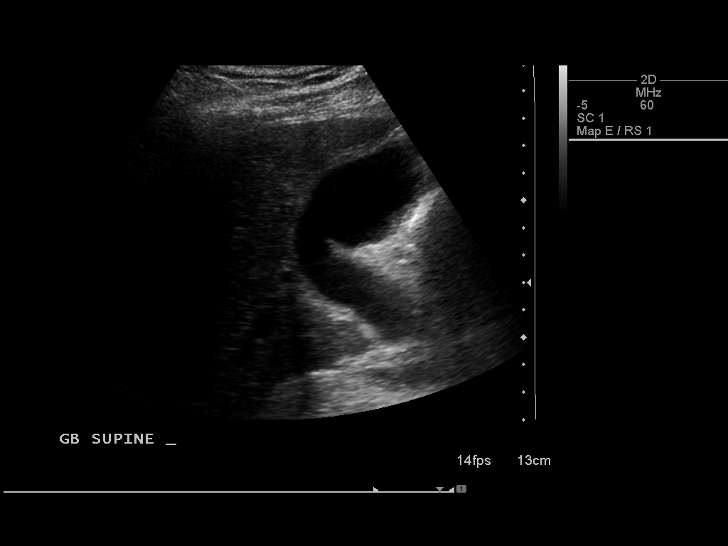
[im 56/84]
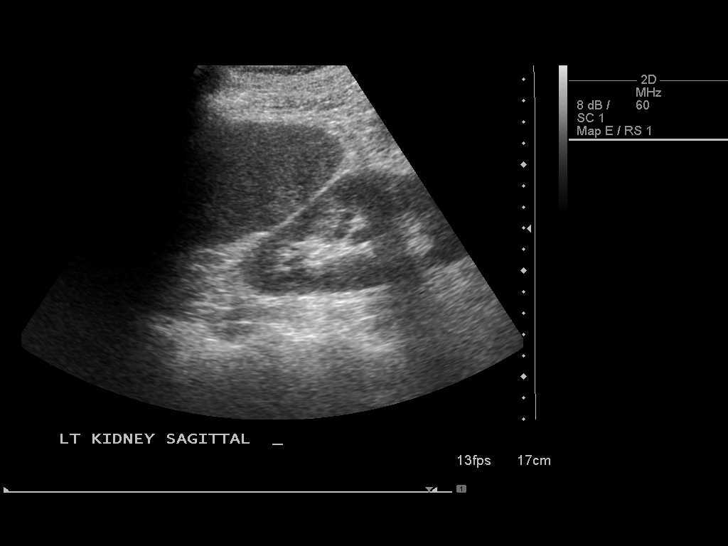
[im 63/84]
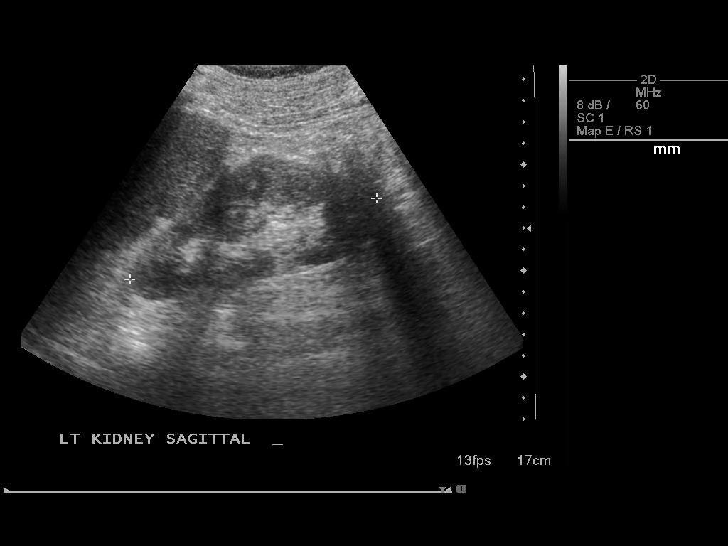
[im 70/84]
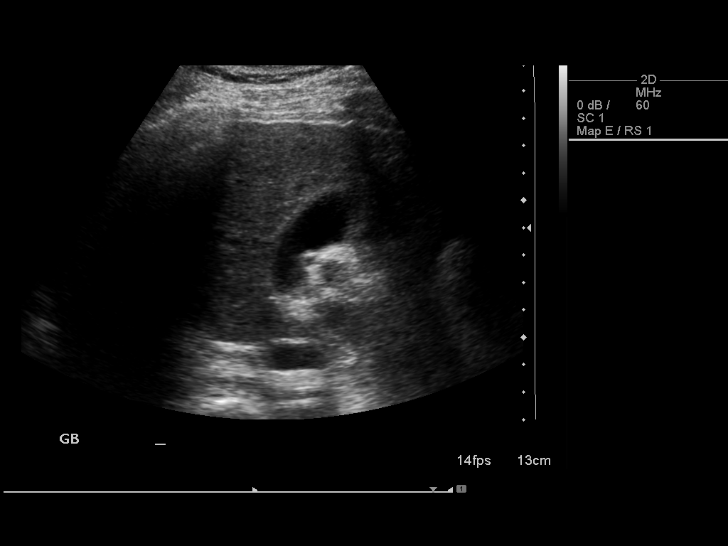
[im 77/84]
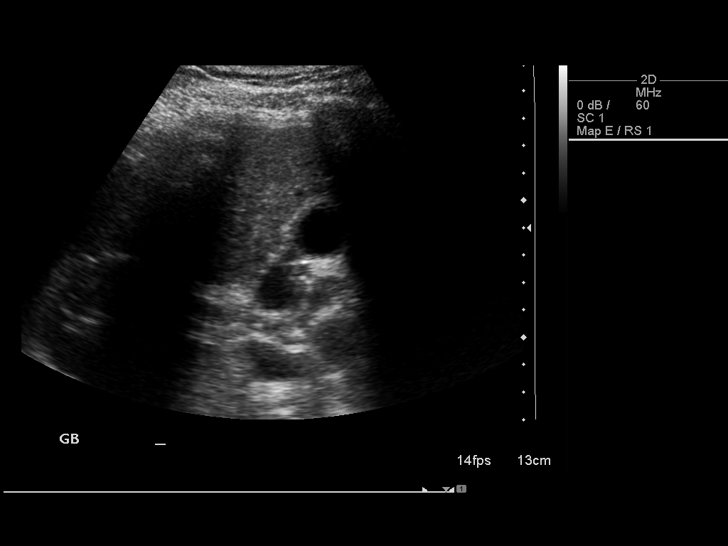
[im 84/84]
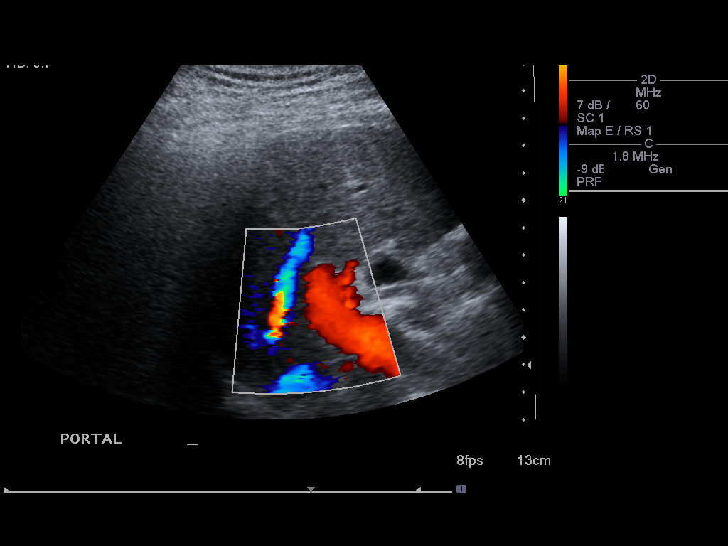

[13 of 25 positions shown; findings below may reference images not displayed]

FINDINGS: Gallbladder:  No gallstones, gallbladder wall thickening, or
pericholecystic fluid. Layering echogenic sludge is noted.
Negative sonographic Murphy's sign.

Common bile duct:  Mild common bile duct dilatation with maximal
measurement of 9.3 mm.  No definite common bile duct stone is
identified.

Liver:  Nodular liver contour with coarse heterogeneous
echogenicity consistent with cirrhosis. Area of decreased
echogenicity in the left lobe (measuring approximately 2 x 1 x
cm) could be a regenerating nodule.  Do not see any lesion on the
CT scan in this area.  MRI surveillance is recommended given the
cirrhosis.

IVC:  Normal caliber.

Pancreas:  Sonographically normal.

Spleen:  Splenomegaly again demonstrated but no focal lesions.

Right Kidney:  11.3 cm in length. Normal renal cortical thickness
and echogenicity without focal lesions or hydronephrosis.

Left Kidney:  12.2 cm in length. Normal renal cortical thickness
and echogenicity without focal lesions or hydronephrosis.

Abdominal aorta:  Normal caliber.
IMPRESSION: 1.  Cirrhotic changes involving the liver as discussed above.
2.  Layering echogenic sludge in the gallbladder.
3.  Splenomegaly

## 2014-05-16 ENCOUNTER — Other Ambulatory Visit (INDEPENDENT_AMBULATORY_CARE_PROVIDER_SITE_OTHER): Payer: 59

## 2014-05-16 ENCOUNTER — Ambulatory Visit (INDEPENDENT_AMBULATORY_CARE_PROVIDER_SITE_OTHER): Payer: 59 | Admitting: Gastroenterology

## 2014-05-16 ENCOUNTER — Encounter: Payer: Self-pay | Admitting: Gastroenterology

## 2014-05-16 VITALS — BP 138/84 | HR 80 | Ht 65.5 in | Wt 201.1 lb

## 2014-05-16 DIAGNOSIS — K7460 Unspecified cirrhosis of liver: Secondary | ICD-10-CM

## 2014-05-16 LAB — PROTIME-INR
INR: 1.4 ratio — AB (ref 0.8–1.0)
PROTHROMBIN TIME: 15.8 s — AB (ref 9.6–13.1)

## 2014-05-16 NOTE — Assessment & Plan Note (Signed)
Etiology is unclear.  Elevated IgG raises the question of autoimmune hepatitis although ANA was negative.  Note slight elevation in antimitochondrial antibody.  Recommendations #1 check anti-liver kidney microsomal antibody #2  alpha-fetoprotein and INR #3 upper endoscopy to rule out varices

## 2014-05-16 NOTE — Progress Notes (Signed)
      History of Present Illness:  Jonathon Cannon has returned for follow-up of cryptogenic cirrhosis.  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  He is asymptomatic.  Etiology has not been determined.  Serologies were not diagnostic except for slightly increased anti-mitochondrial antibody.  He was vaccinated for hepatitis A and B.  He is hepatitis C antibody negative.    Review of Systems: Pertinent positive and negative review of systems were noted in the above HPI section. All other review of systems were otherwise negative.    Current Medications, Allergies, Past Medical History, Past Surgical History, Family History and Social History were reviewed in [facility] record  Vital signs were reviewed in today's medical record. Physical Exam: General: Well developed , well nourished, no acute distress Skin: anicteric Head: Normocephalic and atraumatic Eyes:  sclerae anicteric, EOMI Ears: Normal auditory acuity Mouth: No deformity or lesions Lungs: Clear throughout to auscultation Heart: Regular rate and rhythm; no murmurs, rubs or bruits Abdomen: Soft, non tender and non distended. No masses, hepatosplenomegaly or hernias noted. Normal Bowel sounds Rectal:deferred Musculoskeletal: Symmetrical with no gross deformities  Pulses:  Normal pulses noted Extremities: No clubbing, cyanosis, edema or deformities noted Neurological: Alert oriented x 4, grossly nonfocal Psychological:  Alert and cooperative. Normal mood and affect  See Assessment and Plan under Problem List

## 2014-05-16 NOTE — Patient Instructions (Signed)
Go to the basement for labs today  You have been scheduled for an endoscopy. Please follow written instructions given to you at your visit today. If you use inhalers (even only as needed), please bring them with you on the day of your procedure. Your physician has requested that you go to www.[email or web address] and enter the access code given to you at your visit today. This web site gives a general overview about your procedure. However, you should still follow specific instructions given to you by our office regarding your preparation for the procedure.

## 2014-05-17 LAB — AFP TUMOR MARKER: AFP TUMOR MARKER: 17.6 ng/mL — AB (ref <6.1)

## 2014-05-17 LAB — ANTI-MICROSOMAL ANTIBODY LIVER / KIDNEY: LKM1 Ab: <=20 U (ref <=20.0)

## 2014-05-18 LAB — MITOCHONDRIAL ANTIBODIES: MITOCHONDRIAL M2 AB, IGG: 1.88 — AB (ref <0.91)

## 2014-06-07 ENCOUNTER — Encounter: Payer: Self-pay | Admitting: Gastroenterology

## 2014-07-12 ENCOUNTER — Encounter: Payer: 59 | Admitting: Gastroenterology

## 2014-07-26 ENCOUNTER — Telehealth: Payer: Self-pay | Admitting: Hematology and Oncology

## 2014-07-26 NOTE — Telephone Encounter (Signed)
s.w. pt wife and advise on 11.4 appt moved to 11.7 per MD out of the office.Marland Kitchen..Marland Kitchenok and aware

## 2014-12-07 ENCOUNTER — Telehealth: Payer: Self-pay | Admitting: Hematology and Oncology

## 2014-12-07 ENCOUNTER — Other Ambulatory Visit: Payer: No Typology Code available for payment source

## 2014-12-07 ENCOUNTER — Ambulatory Visit: Payer: No Typology Code available for payment source | Admitting: Hematology and Oncology

## 2014-12-07 NOTE — Telephone Encounter (Signed)
Patient dtr called and cancelled appointments for 11/7. Per dtr [place] - will call back to r/s.

## 2014-12-10 ENCOUNTER — Ambulatory Visit: Payer: Self-pay | Admitting: Hematology and Oncology

## 2014-12-10 ENCOUNTER — Other Ambulatory Visit: Payer: 59

## 2015-05-30 ENCOUNTER — Ambulatory Visit
Admission: RE | Admit: 2015-05-30 | Discharge: 2015-05-30 | Disposition: A | Discharge status: Home / Self Care | Payer: Self-pay | Source: Ambulatory Visit | Attending: Internal Medicine | Admitting: Internal Medicine

## 2015-05-30 ENCOUNTER — Other Ambulatory Visit: Payer: Self-pay | Admitting: Internal Medicine

## 2015-05-30 DIAGNOSIS — R509 Fever, unspecified: Secondary | ICD-10-CM

## 2015-06-06 ENCOUNTER — Other Ambulatory Visit (HOSPITAL_COMMUNITY): Payer: Self-pay | Admitting: Internal Medicine

## 2015-06-06 ENCOUNTER — Other Ambulatory Visit: Payer: Self-pay | Admitting: Internal Medicine

## 2015-06-06 ENCOUNTER — Ambulatory Visit
Admission: RE | Admit: 2015-06-06 | Discharge: 2015-06-06 | Disposition: A | Discharge status: Home / Self Care | Payer: BLUE CROSS/BLUE SHIELD | Source: Ambulatory Visit | Attending: Internal Medicine | Admitting: Internal Medicine

## 2015-06-06 DIAGNOSIS — R140 Abdominal distension (gaseous): Secondary | ICD-10-CM

## 2015-06-07 ENCOUNTER — Ambulatory Visit (HOSPITAL_COMMUNITY)
Admission: RE | Admit: 2015-06-07 | Discharge: 2015-06-07 | Disposition: A | Discharge status: Home / Self Care | Payer: BLUE CROSS/BLUE SHIELD | Source: Ambulatory Visit | Attending: Internal Medicine | Admitting: Internal Medicine

## 2015-06-07 DIAGNOSIS — R188 Other ascites: Secondary | ICD-10-CM | POA: Insufficient documentation

## 2015-06-07 DIAGNOSIS — R140 Abdominal distension (gaseous): Secondary | ICD-10-CM | POA: Diagnosis not present

## 2015-06-07 DIAGNOSIS — K7460 Unspecified cirrhosis of liver: Secondary | ICD-10-CM | POA: Insufficient documentation

## 2015-06-07 DIAGNOSIS — R161 Splenomegaly, not elsewhere classified: Secondary | ICD-10-CM | POA: Insufficient documentation

## 2015-06-11 ENCOUNTER — Ambulatory Visit: Payer: Self-pay | Admitting: Gastroenterology

## 2015-07-25 ENCOUNTER — Other Ambulatory Visit (INDEPENDENT_AMBULATORY_CARE_PROVIDER_SITE_OTHER): Payer: BLUE CROSS/BLUE SHIELD

## 2015-07-25 ENCOUNTER — Encounter: Payer: Self-pay | Admitting: Gastroenterology

## 2015-07-25 ENCOUNTER — Ambulatory Visit (INDEPENDENT_AMBULATORY_CARE_PROVIDER_SITE_OTHER): Payer: BLUE CROSS/BLUE SHIELD | Admitting: Gastroenterology

## 2015-07-25 VITALS — BP 128/90 | HR 68 | Ht 65.5 in | Wt 184.2 lb

## 2015-07-25 DIAGNOSIS — D696 Thrombocytopenia, unspecified: Secondary | ICD-10-CM | POA: Diagnosis not present

## 2015-07-25 DIAGNOSIS — R609 Edema, unspecified: Secondary | ICD-10-CM | POA: Diagnosis not present

## 2015-07-25 DIAGNOSIS — K7460 Unspecified cirrhosis of liver: Secondary | ICD-10-CM

## 2015-07-25 DIAGNOSIS — D638 Anemia in other chronic diseases classified elsewhere: Secondary | ICD-10-CM

## 2015-07-25 DIAGNOSIS — R188 Other ascites: Principal | ICD-10-CM

## 2015-07-25 LAB — CBC WITH DIFFERENTIAL/PLATELET
BASOS ABS: 0 10*3/uL (ref 0.0–0.1)
Basophils Relative: 0.5 % (ref 0.0–3.0)
Eosinophils Absolute: 0.3 10*3/uL (ref 0.0–0.7)
Eosinophils Relative: 7.6 % — ABNORMAL HIGH (ref 0.0–5.0)
HCT: 32.4 % — ABNORMAL LOW (ref 39.0–52.0)
Hemoglobin: 11 g/dL — ABNORMAL LOW (ref 13.0–17.0)
LYMPHS ABS: 1.7 10*3/uL (ref 0.7–4.0)
Lymphocytes Relative: 44.7 % (ref 12.0–46.0)
MCHC: 33.9 g/dL (ref 30.0–36.0)
MCV: 85.3 fl (ref 78.0–100.0)
MONO ABS: 0.6 10*3/uL (ref 0.1–1.0)
Monocytes Relative: 16 % — ABNORMAL HIGH (ref 3.0–12.0)
NEUTROS PCT: 31.2 % — AB (ref 43.0–77.0)
Neutro Abs: 1.2 10*3/uL — ABNORMAL LOW (ref 1.4–7.7)
Platelets: 72 10*3/uL — ABNORMAL LOW (ref 150.0–400.0)
RBC: 3.8 Mil/uL — ABNORMAL LOW (ref 4.22–5.81)
RDW: 18.7 % — ABNORMAL HIGH (ref 11.5–15.5)
WBC: 3.9 10*3/uL — ABNORMAL LOW (ref 4.0–10.5)

## 2015-07-25 LAB — BASIC METABOLIC PANEL
BUN: 11 mg/dL (ref 6–23)
CO2: 31 mEq/L (ref 19–32)
Calcium: 8.7 mg/dL (ref 8.4–10.5)
Chloride: 109 mEq/L (ref 96–112)
Creatinine, Ser: 0.92 mg/dL (ref 0.40–1.50)
GFR: 88.45 mL/min (ref >60.00)
GLUCOSE: 113 mg/dL — AB (ref 70–99)
Potassium: 3.8 mEq/L (ref 3.5–5.1)
Sodium: 139 mEq/L (ref 135–145)

## 2015-07-25 NOTE — Progress Notes (Signed)
[facility] Progress Note:  History: Jonathon Cannon 07/25/2015  Referring physician: Wenda Low, MD  Reason for consult/chief complaint: Cirrhosis   Subjective HPI:  As patient is here for follow-up, his last visit was with Dr. Deatra Cannon in April 2016 as noted below. Dr. Deatra Cannon wrote: "Jonathon Cannon has returned for follow-up of cryptogenic cirrhosis.  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  He is asymptomatic.  Etiology has not been determined.  Serologies were not diagnostic except for slightly increased anti-mitochondrial antibody.  He was vaccinated for hepatitis A and B.  He is hepatitis C antibody negative."  It seems there was a plan for a repeat upper endoscopy, but for unclear reasons the patient did not follow through. He is somewhat tangential historian, it sounds like it may have had something to do with traveling back to [place]. Returning from that trip it sounds like he was unwell with a fever, and for some reason his Lasix was stopped. He describes more abdominal bloating after that, so he resumed the medicine on his own. He tries to control his salt intake, says he is less active than he would like to be.  The patient had a colonoscopy in May 2014, a small "polyp" was removed, but biopsy was normal. An EGD in 2015 showed grade 1 varices and he was started on nadolol. His previous workup was negative, thus he was diagnosed with crypt genic cirrhosis. ROS:  Review of Systems  He denies chest pain dyspnea or dysuria Past Medical History: Past Medical History  Diagnosis Date  . Hypertension   . Hypothyroid   . Pancytopenia ([place])   . Cirrhosis ([place])   . Diabetes mellitus, type II ([place])   . Diabetes mellitus with neuropathy [facility])      Past Surgical History: History reviewed. No pertinent past surgical history.   Family History: Family History  Problem Relation Age of Onset  . Asthma Father   . Colon  cancer Neg Hx     Social History: Social History   Social History  . Marital Status: Married    Spouse Name: N/A  . Number of Children: 4  . Years of Education: N/A   Occupational History  .      [employer].    Social History Main Topics  . Smoking status: Never Smoker   . Smokeless tobacco: Never Used  . Alcohol Use: No     Comment: Occasional  . Drug Use: No  . Sexual Activity: Not Asked   Other Topics Concern  . None   Social History Narrative    Allergies: No Known Allergies  Outpatient Meds: Current Outpatient Prescriptions  Medication Sig Dispense Refill  . benzonatate (TESSALON) 200 MG capsule Take 200 mg by mouth as needed.     . Cholecalciferol (VITAMIN D3) 5000 UNITS CAPS Take 1 capsule by mouth daily.    . cyclobenzaprine (FLEXERIL) 5 MG tablet     . Ferrous Sulfate (PX IRON) 27 MG TABS Take 1 tablet by mouth daily.    . furosemide (LASIX) 20 MG tablet Take 20 mg by mouth daily.    Marland Kitchen HYDROcodone-acetaminophen (NORCO/VICODIN) 5-325 MG per tablet Take 1 tablet by mouth every 6 (six) hours as needed for moderate pain. 15 tablet 0  . levothyroxine (SYNTHROID, LEVOTHROID) 125 MCG tablet Take 125 mcg by mouth daily before breakfast.    . metFORMIN (GLUCOPHAGE) 500 MG tablet Take 500 mg by mouth 2 (two) times daily with a meal.    .  methocarbamol (ROBAXIN) 750 MG tablet     . metoprolol succinate (TOPROL-XL) 25 MG 24 hr tablet     . Multiple Vitamins-Minerals (MULTIVITAMIN WITH MINERALS) tablet Take 1 tablet by mouth daily.    . nadolol (CORGARD) 40 MG tablet Take 1 tablet (40 mg total) by mouth daily. 30 tablet 5  . vitamin B-12 (CYANOCOBALAMIN) 1000 MCG tablet Take 1,000 mcg by mouth daily.     No current facility-administered medications for this visit.      ___________________________________________________________________ Objective  Exam:  BP 128/90 mmHg  Pulse 68  Ht 5' 5.5" (1.664 m)  Wt 184 lb 3.2 oz (83.553 kg)  BMI 30.18  kg/m2   General: this is a(n) Well-appearing man. Speech is fluent, gait is steady   Eyes: sclera anicteric, no redness  ENT: oral mucosa moist without lesions, no cervical or supraclavicular lymphadenopathy, good dentition  CV: RRR without murmur, S1/S2, no JVD, 2+ peripheral edema to the knee bilaterally  Resp: clear to auscultation bilaterally, normal RR and effort noted  GI: Overweight soft, no tenderness, with active bowel sounds. Left lobe of liver is palpable, no spleen tip palpable  Skin; warm and dry, no rash or jaundice noted  Neuro: awake, alert and oriented x 3. Normal gross motor function and fluent speech, no asterixis  Labs:  AFP 17/6 on 05/16/14, 19 in 2015, 13 in 2014  PCP note indicates that he had a Pneumovax 23 on 04/15/2015 on 07/22/2015 WBC 4.5 hemoglobin 10.3 platelets 55 (similar platelet level going back several years) glucose 131 BUN 11 redness in 0.99 sodium 135 potassium 3.2 (on 05/30/2015) Lorad 102 albumin 2.6 total bilirubin 2.3 alkaline phosphatase 93 AST 44 ALT 30 hemoglobin A1c 6.1 total cholesterol 160 triglyceride 121  Radiologic Studies:  Hepatic US 06/07/15 - no mass  Assessment: Encounter Diagnoses  Name Primary?  . Cirrhosis of liver with ascites, unspecified hepatic cirrhosis type ([place]) Yes  . Thrombocytopenia ([place])   . Anemia, chronic disease   . Peripheral edema    His bilirubin is rising slightly, indicate probable slow worsening of his cirrhosis.  Plan: Upper endoscopy to assess esophageal varices  The benefits and risks of the planned procedure were described in detail with the patient or (when appropriate) their health care proxy.  Risks were outlined as including, but not limited to, bleeding, infection, perforation, adverse medication reaction leading to cardiac or pulmonary decompensation, or pancreatitis (if ERCP).  The limitation of incomplete mucosal visualization was also discussed.  No guarantees or warranties were given.  His son-in-law was also present for the discussion and scheduling BMP. Based on results, I will start the appropriate dose of spironolactone Sodium restriction AFP  Lastly, great caution is warranted using metformin in patients with cirrhosis due to the risk of lactic acidosis. A copy of this note will be forwarded to his PCP.  Total 30 minute time, over half spent reviewing the chronic care of cirrhosis and the reason for doing all the above tests.  Nelida Meuse III  CCWenda Low, MD

## 2015-07-25 NOTE — Patient Instructions (Signed)
You have been scheduled for an endoscopy. Please follow written instructions given to you at your visit today. If you use inhalers (even only as needed), please bring them with you on the day of your procedure. Your physician has requested that you go to www.startemmi.com and enter the access code given to you at your visit today. This web site gives a general overview about your procedure. However, you should still follow specific instructions given to you by our office regarding your preparation for the procedure.  Your physician has requested that you go to the basement for the following lab work before leaving today: BMET,AFP  If you are age 47 or older, your body mass index should be between 23-30. Your Body mass index is 30.18 kg/(m^2). If this is out of the aforementioned range listed, please consider follow up with your Primary Care Provider.  If you are age 71 or younger, your body mass index should be between 19-25. Your Body mass index is 30.18 kg/(m^2). If this is out of the aformentioned range listed, please consider follow up with your Primary Care Provider.   Thank you for choosing [facility]  Dr Wilfrid Lund III

## 2015-07-26 ENCOUNTER — Other Ambulatory Visit: Payer: Self-pay

## 2015-07-26 LAB — AFP TUMOR MARKER: AFP-Tumor Marker: 9.5 ng/mL — ABNORMAL HIGH (ref <6.1)

## 2015-07-26 MED ORDER — SPIRONOLACTONE 50 MG PO TABS: 50.0000 mg | ORAL_TABLET | Freq: Every day | ORAL | Status: AC

## 2015-07-26 NOTE — Telephone Encounter (Signed)
The pt was notified of the labs and the rx has been sent also a recall ROV has been set  Up

## 2015-07-29 ENCOUNTER — Encounter: Payer: Self-pay | Admitting: Gastroenterology

## 2015-08-02 ENCOUNTER — Encounter: Payer: Self-pay | Admitting: Gastroenterology

## 2015-08-02 ENCOUNTER — Ambulatory Visit (AMBULATORY_SURGERY_CENTER): Payer: BLUE CROSS/BLUE SHIELD | Admitting: Gastroenterology

## 2015-08-02 VITALS — BP 103/64 | HR 73 | Temp 97.3°F | Resp 11

## 2015-08-02 DIAGNOSIS — K7460 Unspecified cirrhosis of liver: Secondary | ICD-10-CM

## 2015-08-02 LAB — GLUCOSE, CAPILLARY
GLUCOSE-CAPILLARY: 113 mg/dL — AB (ref 65–99)
Glucose-Capillary: 110 mg/dL — ABNORMAL HIGH (ref 65–99)
Glucose-Capillary: 113 mg/dL — ABNORMAL HIGH (ref 65–99)

## 2015-08-02 MED ORDER — SODIUM CHLORIDE 0.9 % IV SOLN: 500.0000 mL | INTRAVENOUS | Status: DC

## 2015-08-02 NOTE — Progress Notes (Signed)
No problems noted in the recovery room. maw 

## 2015-08-02 NOTE — Op Note (Signed)
[facility] Patient Name: Jonathon Cannon Procedure Date: 08/02/2015 9:59 AM MRN: [id number] Endoscopist: Mallie Mussel L. Loletha Carrow , MD Age: 65 Referring MD:  Date of Birth: [birth year] Gender: Male Account #: [id number] Procedure:                Upper GI endoscopy Indications:              Cirrhosis with previously-described grade one                            varices Medicines:                Monitored Anesthesia Care Procedure:                Pre-Anesthesia Assessment:                           - Prior to the procedure, a History and Physical                            was performed, and patient medications and                            allergies were reviewed. The patient's tolerance of                            previous anesthesia was also reviewed. The risks                            and benefits of the procedure and the sedation                            options and risks were discussed with the patient.                            All questions were answered, and informed consent                            was obtained. Prior Anticoagulants: The patient has                            taken no previous anticoagulant or antiplatelet                            agents. ASA Grade Assessment: III - A patient with                            severe systemic disease. After reviewing the risks                            and benefits, the patient was deemed in                            satisfactory condition to undergo the procedure.  After obtaining informed consent, the endoscope was                            passed under direct vision. Throughout the                            procedure, the patient's blood pressure, pulse, and                            oxygen saturations were monitored continuously. The                            Model GIF-HQ190 [id number]) scope was introduced                            through the mouth, and advanced to the second part                             of duodenum. The upper GI endoscopy was                            accomplished without difficulty. The patient                            tolerated the procedure well. Scope In: Scope Out: Findings:                 The examined esophagus was normal.                           Mild portal hypertensive gastropathy was found in                            the entire examined stomach.                           The cardia and gastric fundus were normal on                            retroflexion.                           The examined duodenum was normal. Complications:            No immediate complications. Estimated Blood Loss:     Estimated blood loss: none. Impression:               - Normal esophagus.                           - Portal hypertensive gastropathy.                           - Normal examined duodenum.                           - No specimens collected. Recommendation:           -  Patient has a contact number available for                            emergencies. The signs and symptoms of potential                            delayed complications were discussed with the                            patient. Return to normal activities tomorrow.                            Written discharge instructions were provided to the                            patient.                           - Resume previous diet.                           - Continue present medications, including nadolol.                           - Return to my office in 6 months. Henry L. Loletha Carrow, MD 08/02/2015 10:16:30 AM This report has been signed electronically.

## 2015-08-02 NOTE — Patient Instructions (Signed)
YOU HAD AN ENDOSCOPIC PROCEDURE TODAY AT [facility]:   Refer to the procedure report that was given to you for any specific questions about what was found during the examination.  If the procedure report does not answer your questions, please call your gastroenterologist to clarify.  If you requested that your care partner not be given the details of your procedure findings, then the procedure report has been included in a sealed envelope for you to review at your convenience later.  YOU SHOULD EXPECT: Some feelings of bloating in the abdomen. Passage of more gas than usual.  Walking can help get rid of the air that was put into your GI tract during the procedure and reduce the bloating. If you had a lower endoscopy (such as a colonoscopy or flexible sigmoidoscopy) you may notice spotting of blood in your stool or on the toilet paper. If you underwent a bowel prep for your procedure, you may not have a normal bowel movement for a few days.  Please Note:  You might notice some irritation and congestion in your nose or some drainage.  This is from the oxygen used during your procedure.  There is no need for concern and it should clear up in a day or so.  SYMPTOMS TO REPORT IMMEDIATELY:    Following upper endoscopy (EGD)  Vomiting of blood or coffee ground material  New chest pain or pain under the shoulder blades  Painful or persistently difficult swallowing  New shortness of breath  Fever of 100F or higher  Black, tarry-looking stools  For urgent or emergent issues, a gastroenterologist can be reached at any hour by calling [phone].   DIET: Your first meal following the procedure should be a small meal and then it is ok to progress to your normal diet. Heavy or fried foods are harder to digest and may make you feel nauseous or bloated.  Likewise, meals heavy in dairy and vegetables can increase bloating.  Drink plenty of fluids but you should avoid alcoholic beverages  for 24 hours.  ACTIVITY:  You should plan to take it easy for the rest of today and you should NOT DRIVE or use heavy machinery until tomorrow (because of the sedation medicines used during the test).    FOLLOW UP: Our staff will call the number listed on your records the next business day following your procedure to check on you and address any questions or concerns that you may have regarding the information given to you following your procedure. If we do not reach you, we will leave a message.  However, if you are feeling well and you are not experiencing any problems, there is no need to return our call.  We will assume that you have returned to your regular daily activities without incident.  If any biopsies were taken you will be contacted by phone or by letter within the next 1-3 weeks.  Please call us at [phone] if you have not heard about the biopsies in 3 weeks.    SIGNATURES/CONFIDENTIALITY: You and/or your care partner have signed paperwork which will be entered into your electronic medical record.  These signatures attest to the fact that that the information above on your After Visit Summary has been reviewed and is understood.  Full responsibility of the confidentiality of this discharge information lies with you and/or your care-partner.   Your blood sugar was 113 in the recovery room. You may resume your current medications today, including nadolol.  Please call if any questions or concerns. Return to see Dr. Loletha Carrow in the office in 6 months.

## 2015-08-02 NOTE — Progress Notes (Signed)
Report to PACU, RN, vss, BBS= Clear.  

## 2015-08-05 ENCOUNTER — Telehealth: Payer: Self-pay | Admitting: *Deleted

## 2015-08-05 NOTE — Telephone Encounter (Signed)
  Follow up Call-  Call back number 08/02/2015 06/29/2013  Post procedure Call Back phone  # [phone]  Permission to leave phone message Yes Yes     Patient questions:  Do you have a fever, pain , or abdominal swelling? No. Pain Score  0 *  Have you tolerated food without any problems? Yes.    Have you been able to return to your normal activities? Yes.    Do you have any questions about your discharge instructions: Diet   No. Medications  No. Follow up visit  No.  Do you have questions or concerns about your Care? No.  Actions: * If pain score is 4 or above: No action needed, pain <4. Pt states every thing is ok

## 2015-09-12 ENCOUNTER — Ambulatory Visit: Payer: BLUE CROSS/BLUE SHIELD | Admitting: Internal Medicine

## 2015-10-17 ENCOUNTER — Encounter: Payer: Self-pay | Admitting: Gastroenterology

## 2022-01-02 DEATH — deceased
